# Patient Record
Sex: Male | Born: 1975 | Race: White | Hispanic: No | Marital: Married | Smoking: Never smoker
Health system: Southern US, Community
[De-identification: ages and names within clinical notes are randomized; demographics above are authoritative.]

## PROBLEM LIST (undated history)

## (undated) DIAGNOSIS — J302 Other seasonal allergic rhinitis: Secondary | ICD-10-CM

## (undated) DIAGNOSIS — K219 Gastro-esophageal reflux disease without esophagitis: Secondary | ICD-10-CM

## (undated) DIAGNOSIS — B019 Varicella without complication: Secondary | ICD-10-CM

## (undated) DIAGNOSIS — J349 Unspecified disorder of nose and nasal sinuses: Secondary | ICD-10-CM

## (undated) DIAGNOSIS — R040 Epistaxis: Secondary | ICD-10-CM

## (undated) HISTORY — DX: Unspecified disorder of nose and nasal sinuses: J34.9

## (undated) HISTORY — DX: Epistaxis: R04.0

## (undated) HISTORY — DX: Other seasonal allergic rhinitis: J30.2

## (undated) HISTORY — PX: TONSILLECTOMY AND ADENOIDECTOMY: SUR1326

## (undated) HISTORY — DX: Gastro-esophageal reflux disease without esophagitis: K21.9

## (undated) HISTORY — DX: Varicella without complication: B01.9

---

## 1998-07-09 ENCOUNTER — Emergency Department (HOSPITAL_COMMUNITY): Admission: EM | Admit: 1998-07-09 | Discharge: 1998-07-09 | Payer: Self-pay | Admitting: Emergency Medicine

## 1999-09-09 ENCOUNTER — Emergency Department (HOSPITAL_COMMUNITY): Admission: EM | Admit: 1999-09-09 | Discharge: 1999-09-09 | Payer: Self-pay | Admitting: Emergency Medicine

## 1999-09-09 ENCOUNTER — Encounter: Payer: Self-pay | Admitting: Emergency Medicine

## 2000-03-19 ENCOUNTER — Emergency Department (HOSPITAL_COMMUNITY): Admission: EM | Admit: 2000-03-19 | Discharge: 2000-03-19 | Payer: Self-pay | Admitting: Emergency Medicine

## 2004-12-13 HISTORY — PX: APPENDECTOMY: SHX54

## 2005-06-05 ENCOUNTER — Inpatient Hospital Stay (HOSPITAL_COMMUNITY): Admission: EM | Admit: 2005-06-05 | Discharge: 2005-06-14 | Payer: Self-pay | Admitting: Emergency Medicine

## 2005-06-06 ENCOUNTER — Encounter (INDEPENDENT_AMBULATORY_CARE_PROVIDER_SITE_OTHER): Payer: Self-pay | Admitting: Specialist

## 2007-05-06 ENCOUNTER — Emergency Department (HOSPITAL_COMMUNITY): Admission: EM | Admit: 2007-05-06 | Discharge: 2007-05-06 | Payer: Self-pay | Admitting: Emergency Medicine

## 2008-07-29 ENCOUNTER — Emergency Department (HOSPITAL_COMMUNITY): Admission: EM | Admit: 2008-07-29 | Discharge: 2008-07-30 | Payer: Self-pay | Admitting: Emergency Medicine

## 2010-06-01 ENCOUNTER — Emergency Department (HOSPITAL_COMMUNITY): Admission: EM | Admit: 2010-06-01 | Discharge: 2010-06-02 | Payer: Self-pay | Admitting: Emergency Medicine

## 2011-04-30 NOTE — Op Note (Signed)
Mark Heath, Mark Heath                ACCOUNT NO.:  [id number]   MEDICAL RECORD NO.:  [id number]          PATIENT TYPE:  INP   LOCATION:  5707                         FACILITY:  [facility]   PHYSICIAN:  Gabrielle Dare. Janee Morn, M.D.DATE OF BIRTH:  May 27, 1976   DATE OF PROCEDURE:  06/06/2005  DATE OF DISCHARGE:                                 OPERATIVE REPORT   PREOPERATIVE DIAGNOSES:  1.  Small bowel obstruction.  2.  Sigmoid diverticulitis.   POSTOPERATIVE DIAGNOSIS:  Perforated appendicitis.   PROCEDURE:  1.  Exploratory laparotomy.  2.  Appendectomy.   SURGEON:  Violeta Gelinas, M.D.   ASSISTANT:  Leonie Man, M.D.   ANESTHESIA:  General.   HISTORY OF PRESENT ILLNESS:  The patient is a 35 year old white male with no  previous abdominal surgery who presented with a small bowel obstruction.  He  was admitted by Dr. Lindie Spruce.  He underwent CT scan over night which showed a  proximal small bowel obstruction and sigmoid diverticulitis.  He was brought  to the operating room for emergent exploration.   PROCEDURE IN DETAIL:  Informed consent was obtained.  Patient received  intravenous antibiotics.  He was identified and brought to the operating  room.  General anesthesia was administered.  His abdomen was prepped and  draped in a sterile fashion.  A lower midline incision was made.  Subcutaneous tissues were dissected down, revealing the anterior fascia  which was divided sharply.  The peritoneal cavity was then carefully entered  under direct vision without difficulty.  There was a small amount of  ascites.  Exploration revealed an inflammatory process going on in the  pelvis along the superomedial aspect of the sigmoid colon.  There was a  small piece of small bowel stuck down in there which was mobilized,  revealing a perforated appendicitis with a small collection of purulent  material.  This was sent for culture.  The appendix was mobilized away from  the sigmoid and separated from  the small bowel.  The base was intact.  The  base was then dissected free of the mesocolon and clamped with two straight  clamps and was divided in between.  The mesoappendix was divided with two  Kelly clamps and the appendix was removed, it was very inflamed and  perforated near its tip.  This was sent to pathology.  The mesoappendix was  tied to get excellent hemostasis and the appendiceal stump was suture  ligated with 2-0 silk for excellent closure.  Once that was accomplished,  the abdomen was reinspected.  The sigmoid was intact.  There was some  fibrinous exudate present where the appendix had been laying on top of it.  There did not appear to be a primary sigmoid colon process.  The abdomen was  copiously irrigated with several liters of saline.  Bowel was again checked  and bowel contents easily were starting to flow into the distal collapsed  portion.  There was some inflammation of the mesentery of the piece of small  bowel that was stuck down to this inflammatory process but the bowel wall  was intact.  The bowel was returned to anatomic position, the omentum was  replaced and after ensuring excellent hemostasis the abdomen was then  closed.  The fascia was closed with a running #1 PDS, one from each end of  the  incision tied in the middle.  Subcutaneous tissues were copiously irrigated  and the skin was closed with staples.  Sponge, needle and instrument counts  were correct.  A sterile dressing was applied.  The patient tolerated the  procedure well without apparent complication.  He was taken to the recovery  room in stable condition.       BET/MEDQ  D:  06/06/2005  T:  06/06/2005  Job:  [id number]

## 2011-04-30 NOTE — H&P (Signed)
Mark Heath, Mark Heath                ACCOUNT NO.:  [id number]   MEDICAL RECORD NO.:  [id number]          PATIENT TYPE:  INP   LOCATION:  5707                         FACILITY:  [facility]   PHYSICIAN:  Cherylynn Ridges, M.D.    DATE OF BIRTH:  1976/12/08   DATE OF ADMISSION:  06/05/2005  DATE OF DISCHARGE:                                HISTORY & PHYSICAL   IDENTIFICATION AND CHIEF COMPLAINT:  The patient is a 35 year old with a  several day history of nausea, vomiting, and diarrhea, who comes in with a  radiologic picture of a bowel obstruction.   HISTORY OF PRESENT ILLNESS:  The patient reports that his problems began  about three days ago after doing a significant amount of lifting at his job  at [employer].  He came on with cramping abdominal pain, and since that  time has had some diarrhea, nausea, and vomiting.  The volume of vomitus has  diminished over time, but he has continued to be nauseated and vomiting.  He  came into the emergency room where an evaluation was done with plain films  which demonstrated multiple air fluid levels, but air in the colon, no free  air.  It is indicative of either a high-grade partial small bowel  obstruction or significant ileus.   PAST MEDICAL HISTORY:  Unremarkable.   PAST SURGICAL HISTORY:  Unremarkable.  He has had no previous surgery.   MEDICATIONS:  He takes no medications.   ALLERGIES:  No known drug allergies.   SOCIAL HISTORY:  He does not drink alcohol, take any illicit drugs, or smoke  tobacco.  He works at [employer].  His girlfriend is at his bedside.   REVIEW OF SYSTEMS:  Up until this, he has been healthy.  He has had some  dark stool and concentrated urine by his report, and the amount of urine has  diminished over time.   PHYSICAL EXAMINATION:  VITAL SIGNS:  He is afebrile, pulse 82, blood  pressure 110/65.  SKIN:  Pale, he has decreased skin turgor and parched mucous membranes.  HEENT:  Normocephalic, atraumatic.  Eyes are sunken  in.  There is a little  watery fluid.  Mucous membranes actually are pink, but parched.  NECK:  Supple, there are no carotid bruits, no palpable masses.  LUNGS:  Clear to auscultation.  He has symmetrical excursion.  CARDIAC:  Regular rate and rhythm.  No murmurs, rubs, gallops, or heaves.  ABDOMEN:  Distended, but not tense.  He has no diffuse peritonitis.  He has  hypoactive bowel sounds with no rebound or guarding, no palpable masses, no  focal tenderness.  RECTAL:  Mucoid yellow fluid expressed from the rectum, no gross blood, and  is guaiac negative.  Prostate was normal.   LABORATORY DATA:  His white count was 14,400, his hemoglobin is 15.1.  His  electrolytes show a sodium of 131, a potassium of 2.9.  His PT is 15.6, INR  of 1.2.  UA shows concentration at 1.031.   IMPRESSION:  What appears to be a high-grade partial small bowel  obstruction,  but might be a significant ileus.  I was able to pass a NG tube  in this patient and decompress him, and most of this appeared to be gas and  air, minimal fluid, and the NG tube confirmed to be in position.  I reviewed  the x-rays and inflammatory studies, and I cannot find any source for this  possible obstruction outside of a possible tumor.  I did not find the  patient to have any hernias in his groin as well as in his periumbilical  area.   PLAN:  Admit him, decompress him with NG tube, then do a CT scan of the  abdomen with contrast.  This may give [place] more information about what is  causing the patient's current problem, or it could possibly just be a  significant gastroenteritis which should resolve over time.  His BUN and  creatinine are elevated at 37 and 1.6.  I will rehydrate the patient and  also will place his K+ to 1.9.       JOW/MEDQ  D:  06/05/2005  T:  06/06/2005  Job:  [id number]

## 2011-04-30 NOTE — Discharge Summary (Signed)
NAMECHARLEE, Mark Heath                ACCOUNT NO.:  [id number]   MEDICAL RECORD NO.:  [id number]          PATIENT TYPE:  INP   LOCATION:  5709                         FACILITY:  [facility]   PHYSICIAN:  Lebron Conners, M.D.   DATE OF BIRTH:  Dec 17, 1975   DATE OF ADMISSION:  06/05/2005  DATE OF DISCHARGE:  06/14/2005                                 DISCHARGE SUMMARY   DISCHARGE DIAGNOSES:  1.  Perforated appendicitis status post appendectomy, June 06, 2005.  2.  Wound infection containing E. coli susceptible to Cipro.   HOSPITAL COURSE:  Mr. Matters is a 35 year old male patient who has a three  day history of nausea, vomiting and diarrhea. He presented to [facility] Emergency  Room complaining of lower abdominal pain and discomfort. Initially, he was  felt to have a small bowel obstruction and sigmoid diverticulitis. He was  taken to the operating room by Laurell Josephs E. Janee Morn, M.D. and underwent  exploratory laparotomy. He was found to have a perforated appendicitis and  underwent appendectomy.   Postoperatively, he did have an NG tube and it took several days for him to  have this removed. He gradually increased his ambulation. He did have a  wound infection that grew out E. coli susceptible to Cipro. He was  discharged home on postoperative day #8 in stable condition. He will be  discharged to home on the current medications:  Phenergan, Vicodin, Cipro  for 10 days and Flagyl for 10 days.   Because he does have an open wound secondary to infection, a Home Health  nurse has been arranged. He is not to drive for one week, no lifting for  three weeks. He is not to return to work until cleared by Dr. Janee Morn. He  may increase his activity slowly. He is to followup next week for a wound  check with Dr. Lurene Shadow who assisted on this surgery. On June 21, 2005 at 9:20  a.m., he is to followup with Dr. Janee Morn for his regular visit on June 30, 2005 at 9:45 a.m. He is to call for a fever or 101.       LB/MEDQ  D:  06/14/2005  T:  06/14/2005  Job:  [id number]   cc:   Gabrielle Dare. Janee Morn, M.D.  [facility]  [address] [place], [place] [address]

## 2011-06-13 HISTORY — PX: HERNIA REPAIR: SHX51

## 2011-07-05 ENCOUNTER — Inpatient Hospital Stay (HOSPITAL_COMMUNITY)
Admission: EM | Admit: 2011-07-05 | Discharge: 2011-07-07 | DRG: 352 | Disposition: A | Discharge status: Home / Self Care | Payer: Self-pay | Attending: General Surgery | Admitting: General Surgery

## 2011-07-05 DIAGNOSIS — K429 Umbilical hernia without obstruction or gangrene: Secondary | ICD-10-CM | POA: Diagnosis present

## 2011-07-05 DIAGNOSIS — K4030 Unilateral inguinal hernia, with obstruction, without gangrene, not specified as recurrent: Principal | ICD-10-CM | POA: Diagnosis present

## 2011-07-06 ENCOUNTER — Emergency Department (HOSPITAL_COMMUNITY): Payer: Self-pay

## 2011-07-06 DIAGNOSIS — K4030 Unilateral inguinal hernia, with obstruction, without gangrene, not specified as recurrent: Secondary | ICD-10-CM

## 2011-07-06 DIAGNOSIS — K429 Umbilical hernia without obstruction or gangrene: Secondary | ICD-10-CM

## 2011-07-06 LAB — DIFFERENTIAL
Basophils Relative: 0 % (ref 0–1)
Eosinophils Relative: 1 % (ref 0–5)
Lymphocytes Relative: 31 % (ref 12–46)
Monocytes Absolute: 0.7 10*3/uL (ref 0.1–1.0)
Monocytes Relative: 9 % (ref 3–12)
Neutro Abs: 4.3 10*3/uL (ref 1.7–7.7)

## 2011-07-06 LAB — POCT I-STAT, CHEM 8
BUN: 13 mg/dL (ref 6–23)
Calcium, Ion: 1.23 mmol/L (ref 1.12–1.32)
Chloride: 108 mEq/L (ref 96–112)
Creatinine, Ser: 0.8 mg/dL (ref 0.50–1.35)
HCT: 41 % (ref 39.0–52.0)
Hemoglobin: 13.9 g/dL (ref 13.0–17.0)
Potassium: 4.3 mEq/L (ref 3.5–5.1)
Sodium: 140 mEq/L (ref 135–145)

## 2011-07-06 LAB — CBC
HCT: 39.7 % (ref 39.0–52.0)
MCH: 32.6 pg (ref 26.0–34.0)
Platelets: 230 10*3/uL (ref 150–400)
RBC: 4.38 MIL/uL (ref 4.22–5.81)
RDW: 11.9 % (ref 11.5–15.5)

## 2011-07-08 NOTE — Op Note (Signed)
Mark Heath, Mark Heath NO.:  [id number]  MEDICAL RECORD NO.:  [id number]  LOCATION:  5127                         FACILITY:  [facility]  PHYSICIAN:  Gabrielle Dare. Janee Morn, M.D.DATE OF BIRTH:  September 15, 1976  DATE OF PROCEDURE:  07/06/2011 DATE OF DISCHARGE:                              OPERATIVE REPORT   PREOPERATIVE DIAGNOSES: 1. Umbilical hernia. 2. Incarcerated left inguinal hernia.  POSTOPERATIVE DIAGNOSES: 1. Umbilical hernia. 2. Incarcerated left inguinal hernia.  PROCEDURES: 1. Repair of umbilical hernia with mesh. 2. Repair of incarcerated left inguinal hernia with mesh.  SURGEON:  Gabrielle Dare. Janee Morn, MD  ANESTHESIA:  General endotracheal.  HISTORY OF PRESENT ILLNESS:  Mark Heath is a 35 year old gentleman who was admitted overnight with nausea and vomiting and he was noted to have a reducible umbilical hernia and an incarcerated left inguinal hernia. He is brought to the operating room for repair of both.  PROCEDURE IN DETAIL:  Informed consent was obtained.  The patient was identified in the preop holding area.  He received intravenous antibiotics.  He was brought to the operating room.  General endotracheal anesthesia was administered by the Anesthesia staff.  His abdomen and groins were all prepped and draped in sterile fashion.  We did a time-out procedure.  Attention was first directed to the umbilical hernia area.  A towel was placed over the groin.  In the interim, the area beneath the umbilicus was infiltrated with 0.25% Marcaine with epinephrine.  An infraumbilical incision was made.  Subcutaneous tissues were dissected down and around the umbilical stump.  The umbilical skin was dissected free off from the underlying tissues.  The hernia sac was then opened, it contained only omentum.  The hernia sac was gradually excised and the omentum was freed up circumferentially and reduced back into the abdomen after ensuring hemostasis.  The fascia was  cleaned up circumferentially.  The hernia repair was then completed with a 4.3-cm Proceed hernia system patch.  The mesh was inserted and laid flat and up against the fascia.  The inferior leaflet was tacked to the fascia with interrupted 2-0 Prolene sutures and then trimmed away.  The superior leaflet of the mesh was then tacked to the fascia with interrupted 2-0 Prolene sutures and that the flap was also excised.  Next, a lateral 2-0 Prolene suture was placed on either side, tacking the mesh to the fascia.  Area was copiously irrigated.  Hemostasis was ensured.  The umbilicus was then tacked back down to the underlying fascia with 2-0 Vicryl sutures in the subcutaneous tissue.  Further subcutaneous tissues were closed with interrupted 3-0 Vicryl suture and the skin was closed with running 4-0 Monocryl subcuticular stitch followed by Dermabond. Sponge, needle, and instrument counts were all correct for that part of the procedure.  We then turned our attention to the left groin.  The area of the anterosuperior iliac spine on the left side and then downalong the planned line of groin incision was infiltrated with 0.25% Marcaine with epinephrine.  Left groin incision was made.  Subcutaneous tissues were dissected down through Scarpa fascia, revealing the attenuated external oblique fascia.  There was a large hernia medially. The  fascia was divided laterally and the division was continued through the external ring.  The inferior leaflet of the external oblique was dissected free off the cord and the hernia revealing the shelving edge of the inguinal ligament and then the superior leaflet was dissected free, revealing the transversalis.  The cord was then dissected with a large hernia sac containing omentum identified.  This was dissected free from the cord structures protecting them.  The hernia sac was then opened, it was quite attenuated, but it contained only omentum.  This was all reduced  back into the abdomen and the hernia sac was high ligated with a suture ligature of 2-0 Vicryl.  Next, the floor was inspected and there was no direct hernia component.  The hernia repair was then completed with a keyhole polypropylene mesh.  This was trimmed to custom size and shape and then tacked to the tissues over the pubic tubercle medially with 2-0 Prolene.  We then used running 2-0 Prolene to tack this mesh down to the shelving edge of the inguinal ligament and more out laterally.  Next, the superior portion of the mesh was tacked down to the tissues over the pubic tubercle again medially with interrupted 2-0 Prolene sutures and then a series of interrupted 2-0 Prolene sutures were used to tack the mesh down to the transversalis, extending out laterally.  The 2 leaflets of the mesh were tacked together into the underlying musculature at far side of the cord structures.  Leaflets of the mesh were left long and tucked out laterally beneath the external oblique fascia and further interrupted 2- 0 Prolene sutures were placed to tack them together into the underlying musculature.  Next, some additional local anesthetic was placed. Meticulous hemostasis was ensured.  The external oblique fascia was then closed with a running 2-0 Vicryl suture.  Subcutaneous tissues were irrigated and then closed with interrupted 3-0 Vicryl sutures and the skin was closed with running 4-0 Monocryl subcuticular stitch followed by Dermabond.  The sponge, needle, and instrument counts were all correct.  The patient's left testicle was returned to anatomic position in the scrotum at the complication of the procedure.  There were no apparent complications and he was taken to the recovery room in stable condition.     Gabrielle Dare Janee Morn, M.D.     BET/MEDQ  D:  07/06/2011  T:  07/07/2011  Job:  [id number]  Electronically Signed by Violeta Gelinas M.D. on 07/08/2011 08:49:42 AM

## 2011-07-11 NOTE — H&P (Signed)
NAME:  Mark Heath, Mark Heath NO.:  [id number]  MEDICAL RECORD NO.:  [id number]  LOCATION:  [facility]                         FACILITY:  [facility]  PHYSICIAN:  Abigail Miyamoto, M.D. DATE OF BIRTH:  06-07-1976  DATE OF ADMISSION:  07/05/2011 DATE OF DISCHARGE:                             HISTORY & PHYSICAL   CHIEF COMPLAINT:  Abdominal pain and diarrhea.  HISTORY:  This is a 35 year old gentleman who reports having had a known left inguinal hernia and umbilical hernia, presents with abdominal pain and diarrhea.  He reports the pain is very mild, felt the diarrhea, presented to the emergency department.  He was found to have a chronic incarcerated inguinal hernia as well as umbilical hernia and surgeries. The patient denied any nausea or vomiting.  He has had for some time the inguinal hernia going down into his scrotum.  He currently is in almost no pain.  He otherwise is without complaints.  The pain he was having was mild, cramping in nature.  PAST MEDICAL AND SURGICAL HISTORY:  Exploratory laparotomy, appropriate appendicitis in 2006.  Otherwise, he is healthy.  MEDICATIONS:  None.  ALLERGIES:  No known drug allergies.  SOCIAL HISTORY:  He does not smoke, does not drink alcohol.  He does a lot of heavy lifting job.  FAMILY HISTORY:  Negative for hypertension or diabetes.  REVIEW OF SYSTEMS:  GENERAL:  Negative for fever or chills.  PULMONARY: Negative for cough, shortness breath, or difficulty breathing.  CARDIAC: Negative for chest pain, irregular heartbeat.  ABDOMEN:  Listed as above.  URINARY:  Negative for dysuria or hematuria.  Rest of review of systems including skin, eyes, ears, nose, throat, musculoskeletal, psychiatric, endocrine normal.  PHYSICAL EXAMINATION:  GENERAL:  This is a well-developed, well- nourished gentleman, in no acute distress. VITAL SIGNS:  Temperature 98.1, heart rate 48, respiratory rate 16, and blood pressure is 108/60. EYES:   Anicteric.  Pupils are reactive bilaterally. ENT:  External ears and nose are normal.  Hearing is normal.  Oropharynx is clear. NECK:  Supple.  Trachea is midline.  There is no thyromegaly. LUNGS:  Clear to auscultation bilaterally.  No respiratory effort. CARDIOVASCULAR:  Regular rate and rhythm.  There are no murmurs.  There is no peripheral edema. ABDOMEN:  Soft.  He has a well-healed midline incision.  There is an easily reducible umbilical hernia.  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  This is nontender as suspected, contained omentum.  Testicles descended bilaterally.  Abdomen is otherwise soft and nontender.  There is no tenderness of the left inguinal area. EXTREMITIES:  Warm and well perfused.  No edema, clubbing, or cyanosis. Peripheral pulses are intact to all 4 extremities.  SKIN:  No rashes and no jaundice. NEUROLOGIC:  He is awake, alert, and oriented.  Motor and sensory function grossly intact to all 4 extremities. PSYCHIATRIC:  Normal judgment and affect.  DATA REVIEWED:  The patient's white blood count is 7.4 with no left shift.  IMPRESSION:  This is a patient with a slightly symptomatic, chronically incarcerated left inguinal hernia as well as a reducible umbilical hernia. At this point, the patient is in no distress.  I do not believe emergent repair is necessary at this point.  We will go, however, to consider a repair later today.  I will currently keep him n.p.o. until at that time.  Hopefully, both inguinal hernia and umbilical hernia can be fixed with mesh during this admission.     Abigail Miyamoto, M.D.     DB/MEDQ  D:  07/06/2011  T:  07/06/2011  Job:  [id number]  Electronically Signed by Abigail Miyamoto M.D. on 07/11/2011 02:04:47 PM

## 2011-07-15 ENCOUNTER — Telehealth (INDEPENDENT_AMBULATORY_CARE_PROVIDER_SITE_OTHER): Payer: Self-pay

## 2011-07-15 NOTE — Telephone Encounter (Signed)
Patient walked in for refill of percocet and I offered him the protocol.  I called in Hydrocodone 5-325 one tab po q4-6prn #30 no refills generic allowed to [facility]  [phone].  He also asked for a return to work note and I wrote one for September 4th since he works at [employer] and lifts at least 70 lbs.  He will Dr Andrey Campanile on August 10th  in Dr Thompson's absence.

## 2011-07-23 ENCOUNTER — Ambulatory Visit (INDEPENDENT_AMBULATORY_CARE_PROVIDER_SITE_OTHER): Payer: BC Managed Care – PPO | Admitting: General Surgery

## 2011-07-23 ENCOUNTER — Encounter (INDEPENDENT_AMBULATORY_CARE_PROVIDER_SITE_OTHER): Payer: Self-pay | Admitting: General Surgery

## 2011-07-23 DIAGNOSIS — Z09 Encounter for follow-up examination after completed treatment for conditions other than malignant neoplasm: Secondary | ICD-10-CM

## 2011-07-23 NOTE — Progress Notes (Signed)
Procedure: Status post umbilical hernia repair with mesh; status post repair of incarcerated left inguinal hernia with mesh July 23 by Dr. Janee Morn  History of Present Ilness: 35 year old male comes in today for his first postoperative appointment. He came into the ER several weeks ago with an incarcerated left inguinal hernia. Since discharge she is been doing fairly well. He had a fair amount of pain initially the first week after surgery. He states that he still has some discomfort in the left groin. This swelling has gone down dramatically. He denies any fevers or chills. He denies any nausea, vomiting, and diarrhea or constipation. He denies any dysuria. He is  concerned that he cannot do any heavy lifting for several more weeks.  Physical Exam: Well-developed well-nourished Caucasian male in no apparent distress Pulmonary-lungs are clear to auscultation Cardiac-regular rate and rhythm Abdomen-soft, nontender, and nondistended. Well-healed transverse infraumbilical incision. No hematoma or seroma. No signs of hernia recurrence. He has an old well-healed lower midline incision that is invaginated. GU-well-healed left inguinal incision. There appears to be a probable hematoma in the left inguinal canal. It does not seem to be consistent with a recurrence. This hard mass extends into his scrotum. Both testicles are descended. There is no signs of cellulitis or induration. Psych-appears slightly nervous  Data reviewed: I reviewed Dr. Carollee Massed operative note  Assessment and Plan: Status post umbilical hernia repair with mesh Status post repair of incarcerated left inguinal hernia with mesh  It appears that he may have developed a postoperative hematoma in his left groin. I advised him that this should continue to get smaller over time. I advised him that he should not lift anything greater than 10-15 pounds until after September 4 which will be 6 weeks after surgery. We will make  a followup  appointment with him to see Dr. Janee Morn in 4 weeks.

## 2011-07-23 NOTE — Patient Instructions (Signed)
Can resume full activities after August 17, 2011

## 2011-08-18 ENCOUNTER — Ambulatory Visit (INDEPENDENT_AMBULATORY_CARE_PROVIDER_SITE_OTHER): Payer: BC Managed Care – PPO | Admitting: General Surgery

## 2011-08-18 VITALS — BP 100/70 | HR 96

## 2011-08-18 DIAGNOSIS — Z09 Encounter for follow-up examination after completed treatment for conditions other than malignant neoplasm: Secondary | ICD-10-CM

## 2011-08-18 DIAGNOSIS — Z9889 Other specified postprocedural states: Secondary | ICD-10-CM

## 2011-08-18 DIAGNOSIS — Z8719 Personal history of other diseases of the digestive system: Secondary | ICD-10-CM

## 2011-08-18 NOTE — Progress Notes (Signed)
Subjective:     Patient ID: Mark Heath, male   DOB: 1976/05/13, 35 y.o.   MRN: [id number]  HPI  Patient is status post umbilical hernia repair with mesh and left inguinal hernia repair with mesh while hospitalized. Postoperatively doing well. The swelling has resolved. He has no pain. He is eating and moving his bowels well. He is having no trouble passing his urine. Review of Systems     Objective:   Physical Exam On physical exam his umbilical incision is well-healed. His hernia repair is intact. His left inguinal incision is also well healed. There are no signs of infection at either site. The patient's testicles are descended without significant edema or tenderness. His inguinal hernia repair is intact    Assessment:     Doing very well status post umbilical hernia repair with mesh and left inguinal hernia repair with mesh    Plan:        Return to work without restriction starting August 23, 2011 return p.r.n.

## 2013-07-09 ENCOUNTER — Encounter: Payer: Self-pay | Admitting: Family Medicine

## 2013-07-09 ENCOUNTER — Ambulatory Visit (INDEPENDENT_AMBULATORY_CARE_PROVIDER_SITE_OTHER): Payer: BC Managed Care – PPO | Admitting: Family Medicine

## 2013-07-09 VITALS — BP 128/88 | Temp 98.7°F | Ht 69.0 in | Wt 179.0 lb

## 2013-07-09 DIAGNOSIS — R0989 Other specified symptoms and signs involving the circulatory and respiratory systems: Secondary | ICD-10-CM

## 2013-07-09 DIAGNOSIS — J309 Allergic rhinitis, unspecified: Secondary | ICD-10-CM

## 2013-07-09 DIAGNOSIS — Z23 Encounter for immunization: Secondary | ICD-10-CM

## 2013-07-09 DIAGNOSIS — Z7689 Persons encountering health services in other specified circumstances: Secondary | ICD-10-CM

## 2013-07-09 DIAGNOSIS — Z7189 Other specified counseling: Secondary | ICD-10-CM

## 2013-07-09 DIAGNOSIS — R0683 Snoring: Secondary | ICD-10-CM

## 2013-07-09 MED ORDER — FLUTICASONE PROPIONATE 50 MCG/ACT NA SUSP: 2.0000 | Freq: Every day | NASAL | Status: DC

## 2013-07-09 NOTE — Addendum Note (Signed)
Addended by: Azucena Freed on: 07/09/2013 02:54 PM   Modules accepted: Orders

## 2013-07-09 NOTE — Patient Instructions (Addendum)
-  We have ordered labs or studies at this visit. It can take up to 1-2 weeks for results and processing. We will contact you with instructions IF your results are abnormal. Normal results will be released to your [facility]. If you have not heard from [place] or can not find your results in [facility] in 2 weeks please contact our office.  -PLEASE SIGN UP FOR MYCHART TODAY   We recommend the following healthy lifestyle measures: - eat a healthy diet consisting of lots of vegetables, fruits, beans, nuts, seeds, healthy meats such as white chicken and fish and whole grains.  - avoid fried foods, fast food, processed foods, sodas, red meet and other fattening foods.  - get a least 150 minutes of aerobic exercise per week.   -We placed a referral for you as discussed for a sleep sudy. It usually takes about 1-2 weeks to process and schedule this referral. If you have not heard from [place] regarding this appointment in 2 weeks please contact our office.  For sinus issues: -start Flonase 2 sprays each nostril daily for 1 month, then one spray each nostril -if not completely better in 2 weeks add claritin or allegra daily -follow up in 2 months  Follow up in: in next week for fasting labs, and in 2 months for sinuses

## 2013-07-09 NOTE — Progress Notes (Signed)
Chief Complaint  Patient presents with  . Establish Care  . Sleep Apnea  . Sinusitis    HPI:  Mark Heath is here to establish care.  Last PCP and physical: has not had PCP in a long time  Has the following chronic problems and concerns today:  1) chronic allergic rhinitis: -chronic nasal congestion and pnd - worse in spring and fall -also snores at night, wide thinks he stops breathing at night -denies: SOB, wheezing, daytime somnolence  There are no active problems to display for this patient.   Health Maintenance: -can't remember last tetanus shot  ROS: See pertinent positives and negatives per HPI.  Past Medical History  Diagnosis Date  . Sinus problem   . Bleeding nose     as a child  . Chicken pox   . GERD (gastroesophageal reflux disease)   . Seasonal allergies     Family History  Problem Relation Age of Onset  . Hypertension Mother   . Heart disease Mother 67  . Arthritis Mother   . Hyperlipidemia Mother   . Stroke Mother   . Diverticulitis Mother   . Hypertension Father   . Arthritis Father   . Hyperlipidemia Father   . Diabetes Father   . Dementia Father     History   Social History  . Marital Status: Married    Spouse Name: N/A    Number of Children: N/A  . Years of Education: N/A   Social History Main Topics  . Smoking status: Never Smoker   . Smokeless tobacco: None  . Alcohol Use: No  . Drug Use: No  . Sexually Active: None   Other Topics Concern  . 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  GENERAL: vitals reviewed and listed above, alert, oriented, appears well hydrated and in no acute distress  HEENT: atraumatic, conjunttiva clear, no obvious abnormalities on inspection of external nose and ears, normal appearance of ear canals and TMs, clear nasal congestion, mild post oropharyngeal erythema with PND, no tonsillar edema or exudate, no sinus TTP  NECK: no obvious masses on inspection  LUNGS: clear to auscultation bilaterally, no wheezes, rales or rhonchi, good air movement  CV: HRRR, no peripheral edema  MS: moves all extremities without noticeable abnormality  PSYCH: pleasant and cooperative, no obvious depression or anxiety  ASSESSMENT AND PLAN:  Discussed the following assessment and plan:  Encounter to establish care - Plan: Lipid Panel, Hemoglobin A1c  Snoring - Plan: Split night study  Allergic rhinitis - Plan: fluticasone (FLONASE) 50 MCG/ACT nasal spray    -We reviewed the PMH, PSH, FH, SH, Meds and Allergies. -We provided refills for any medications we will prescribe as needed. -We addressed current concerns per orders and patient instructions. -We have asked for records for pertinent exams, studies, vaccines and notes from previous providers. -We have advised patient to follow up per instructions below. -  tdap today  -Patient advised to return or notify a doctor immediately if symptoms worsen or persist or new concerns arise.  Patient Instructions  -We have ordered labs or studies at this visit. It can take up to 1-2 weeks for results and processing. We will contact you with instructions IF your results are abnormal. Normal results will be released to your [facility] : [facility]. If you have not heard from [place] or can not find your results in [facility] in 2 weeks please contact our office.  -PLEASE SIGN UP FOR MYCHART TODAY   We recommend the following healthy lifestyle measures: - eat a healthy diet consisting of lots of vegetables, fruits, beans, nuts,  seeds, healthy meats such as white chicken and fish and whole grains.  - avoid fried foods, fast food, processed foods, sodas, red meet and other fattening foods.  - get a least 150 minutes of aerobic exercise per week.   -We placed a referral for you as discussed for a sleep sudy. It usually takes about 1-2 weeks to process and schedule this referral. If you have not heard from [place] regarding this appointment in 2 weeks please contact our office.  For sinus issues: -start Flonase 2 sprays each nostril daily for 1 month, then one spray each nostril -if not completely better in 2 weeks add claritin or allegra daily -follow up in 2 months  Follow up in: in next week for fasting labs, and in 2 months for sinuses      Latriece Anstine R.

## 2013-07-10 NOTE — Addendum Note (Signed)
Addended by: Azucena Freed on: 07/10/2013 10:16 AM   Modules accepted: Orders

## 2013-08-10 ENCOUNTER — Ambulatory Visit (INDEPENDENT_AMBULATORY_CARE_PROVIDER_SITE_OTHER): Payer: BC Managed Care – PPO

## 2013-08-10 DIAGNOSIS — Z23 Encounter for immunization: Secondary | ICD-10-CM

## 2013-08-19 ENCOUNTER — Ambulatory Visit (HOSPITAL_BASED_OUTPATIENT_CLINIC_OR_DEPARTMENT_OTHER): Payer: BC Managed Care – PPO | Attending: Family Medicine

## 2013-11-22 ENCOUNTER — Encounter (HOSPITAL_COMMUNITY): Payer: Self-pay | Admitting: Emergency Medicine

## 2013-11-22 ENCOUNTER — Emergency Department (HOSPITAL_COMMUNITY)
Admission: EM | Admit: 2013-11-22 | Discharge: 2013-11-22 | Disposition: A | Discharge status: Home / Self Care | Payer: BC Managed Care – PPO | Source: Home / Self Care | Attending: Family Medicine | Admitting: Family Medicine

## 2013-11-22 DIAGNOSIS — J069 Acute upper respiratory infection, unspecified: Secondary | ICD-10-CM

## 2013-11-22 MED ORDER — AZITHROMYCIN 250 MG PO TABS: 250.0000 mg | ORAL_TABLET | Freq: Every day | ORAL | Status: DC

## 2013-11-22 MED ORDER — IPRATROPIUM BROMIDE 0.06 % NA SOLN: 2.0000 | Freq: Four times a day (QID) | NASAL | Status: DC

## 2013-11-22 MED ORDER — GUAIFENESIN-CODEINE 100-10 MG/5ML PO SOLN: 5.0000 mL | Freq: Every evening | ORAL | Status: DC | PRN

## 2013-11-22 NOTE — ED Provider Notes (Signed)
Mark Heath is a 37 y.o. male who presents to Urgent Care today for cough and congestion with mild nasal discharge and sore throat. This is been present for about 4 days but worsened over the past day or 2. He also notes some muscle soreness. He denies any nausea vomiting or diarrhea. He denies any shortness of breath or chest pains. History of over-the-counter cough medication and Motrin which is been mildly helpful. He is well otherwise.   Past Medical History  Diagnosis Date  . Sinus problem   . Bleeding nose     as a child  . Chicken pox   . GERD (gastroesophageal reflux disease)   . Seasonal allergies    History  Substance Use Topics  . Smoking status: Never Smoker   . Smokeless tobacco: Not on file  . Alcohol Use: No   ROS as above Medications reviewed. No current facility-administered medications for this encounter.   Current Outpatient Prescriptions  Medication Sig Dispense Refill  . azithromycin (ZITHROMAX) 250 MG tablet Take 1 tablet (250 mg total) by mouth daily. Take first 2 tablets together, then 1 every day until finished.  6 tablet  0  . guaiFENesin-codeine 100-10 MG/5ML syrup Take 5 mLs by mouth at bedtime as needed for cough.  120 mL  0  . ipratropium (ATROVENT) 0.06 % nasal spray Place 2 sprays into both nostrils 4 (four) times daily.  15 mL  1  . [DISCONTINUED] fluticasone (FLONASE) 50 MCG/ACT nasal spray Place 2 sprays into the nose daily.  16 g  6    Exam:  BP 135/85  Pulse 87  Temp(Src) 98.8 F (37.1 C) (Oral)  Resp 16  SpO2 99% Gen: Well NAD HEENT: EOMI,  MMM, tympanic membranes are normal appearing bilaterally. Posterior pharynx with cobblestoning Lungs: Normal work of breathing. CTABL Heart: RRR no MRG Abd: NABS, Soft. NT, ND Exts: Non edematous BL  LE, warm and well perfused.   Assessment and Plan: 37 y.o. male with viral URI. Plan for symptomatic management with codeine containing cough medication, Atrovent and NSAIDs. If not better would  consider using azithromycin which I prescribed. Discussed warning signs or symptoms. Please see discharge instructions. Patient expresses understanding.      Rodolph Bong, MD 11/22/13 [phone]

## 2013-11-22 NOTE — ED Notes (Signed)
Pt c/o cold sxs onset 4 days.... Reports his children are sick w/colds as well Sxs today include: cough w/yellow mucous, congestion, ST Has tried OTC cold meds w/no relief. Denies: f/v/n/d, SOB, wheezing Alert w/no signs of acute distress.

## 2015-08-12 ENCOUNTER — Inpatient Hospital Stay (HOSPITAL_COMMUNITY)
Admission: EM | Admit: 2015-08-12 | Discharge: 2015-08-16 | DRG: 351 | Disposition: A | Discharge status: Home / Self Care | Payer: BLUE CROSS/BLUE SHIELD | Attending: General Surgery | Admitting: General Surgery

## 2015-08-12 ENCOUNTER — Emergency Department (HOSPITAL_COMMUNITY): Payer: BLUE CROSS/BLUE SHIELD

## 2015-08-12 ENCOUNTER — Encounter (HOSPITAL_COMMUNITY): Admission: EM | Disposition: A | Discharge status: Home / Self Care | Payer: Self-pay | Source: Home / Self Care

## 2015-08-12 ENCOUNTER — Emergency Department (HOSPITAL_COMMUNITY): Payer: BLUE CROSS/BLUE SHIELD | Admitting: Anesthesiology

## 2015-08-12 ENCOUNTER — Encounter (HOSPITAL_COMMUNITY): Payer: Self-pay | Admitting: *Deleted

## 2015-08-12 DIAGNOSIS — I861 Scrotal varices: Secondary | ICD-10-CM | POA: Diagnosis present

## 2015-08-12 DIAGNOSIS — R300 Dysuria: Secondary | ICD-10-CM | POA: Diagnosis present

## 2015-08-12 DIAGNOSIS — Z8249 Family history of ischemic heart disease and other diseases of the circulatory system: Secondary | ICD-10-CM

## 2015-08-12 DIAGNOSIS — K4031 Unilateral inguinal hernia, with obstruction, without gangrene, recurrent: Principal | ICD-10-CM | POA: Diagnosis present

## 2015-08-12 DIAGNOSIS — N508 Other specified disorders of male genital organs: Secondary | ICD-10-CM | POA: Diagnosis not present

## 2015-08-12 DIAGNOSIS — R319 Hematuria, unspecified: Secondary | ICD-10-CM | POA: Diagnosis present

## 2015-08-12 DIAGNOSIS — Z833 Family history of diabetes mellitus: Secondary | ICD-10-CM

## 2015-08-12 DIAGNOSIS — Z823 Family history of stroke: Secondary | ICD-10-CM

## 2015-08-12 DIAGNOSIS — K219 Gastro-esophageal reflux disease without esophagitis: Secondary | ICD-10-CM | POA: Diagnosis present

## 2015-08-12 DIAGNOSIS — K567 Ileus, unspecified: Secondary | ICD-10-CM | POA: Diagnosis not present

## 2015-08-12 DIAGNOSIS — K4030 Unilateral inguinal hernia, with obstruction, without gangrene, not specified as recurrent: Secondary | ICD-10-CM | POA: Diagnosis present

## 2015-08-12 DIAGNOSIS — K460 Unspecified abdominal hernia with obstruction, without gangrene: Secondary | ICD-10-CM

## 2015-08-12 HISTORY — PX: INGUINAL HERNIA REPAIR: SHX194

## 2015-08-12 LAB — CBC WITH DIFFERENTIAL/PLATELET
BASOS ABS: 0 10*3/uL (ref 0.0–0.1)
Basophils Relative: 0 % (ref 0–1)
Eosinophils Absolute: 0 10*3/uL (ref 0.0–0.7)
Eosinophils Relative: 0 % (ref 0–5)
HEMATOCRIT: 42.9 % (ref 39.0–52.0)
Hemoglobin: 15 g/dL (ref 13.0–17.0)
LYMPHS PCT: 18 % (ref 12–46)
Lymphs Abs: 1.6 10*3/uL (ref 0.7–4.0)
MCH: 32.3 pg (ref 26.0–34.0)
MCHC: 35 g/dL (ref 30.0–36.0)
MCV: 92.3 fL (ref 78.0–100.0)
Monocytes Absolute: 0.6 10*3/uL (ref 0.1–1.0)
Monocytes Relative: 7 % (ref 3–12)
NEUTROS ABS: 6.8 10*3/uL (ref 1.7–7.7)
Neutrophils Relative %: 75 % (ref 43–77)
Platelets: 238 10*3/uL (ref 150–400)
RBC: 4.65 MIL/uL (ref 4.22–5.81)
RDW: 11.9 % (ref 11.5–15.5)
WBC: 9 10*3/uL (ref 4.0–10.5)

## 2015-08-12 LAB — COMPREHENSIVE METABOLIC PANEL
ALT: 27 U/L (ref 17–63)
AST: 28 U/L (ref 15–41)
Albumin: 3.9 g/dL (ref 3.5–5.0)
Alkaline Phosphatase: 47 U/L (ref 38–126)
Anion gap: 9 (ref 5–15)
BILIRUBIN TOTAL: 1.3 mg/dL — AB (ref 0.3–1.2)
BUN: 15 mg/dL (ref 6–20)
CHLORIDE: 107 mmol/L (ref 101–111)
CO2: 23 mmol/L (ref 22–32)
CREATININE: 0.9 mg/dL (ref 0.61–1.24)
Calcium: 9.5 mg/dL (ref 8.9–10.3)
GFR calc Af Amer: >60 mL/min (ref >60)
Glucose, Bld: 147 mg/dL — ABNORMAL HIGH (ref 65–99)
Potassium: 3.7 mmol/L (ref 3.5–5.1)
Sodium: 139 mmol/L (ref 135–145)
Total Protein: 6.7 g/dL (ref 6.5–8.1)

## 2015-08-12 LAB — I-STAT CG4 LACTIC ACID, ED: Lactic Acid, Venous: 1.34 mmol/L (ref 0.5–2.0)

## 2015-08-12 LAB — LIPASE, BLOOD: LIPASE: 20 U/L — AB (ref 22–51)

## 2015-08-12 SURGERY — REPAIR, HERNIA, INGUINAL, INCARCERATED
Anesthesia: General | Site: Groin | Laterality: Left

## 2015-08-12 MED ORDER — MIDAZOLAM HCL 2 MG/2ML IJ SOLN
INTRAMUSCULAR | Status: DC | PRN
  Administered 2015-08-12: 2 mg via INTRAVENOUS

## 2015-08-12 MED ORDER — PHENYLEPHRINE HCL 10 MG/ML IJ SOLN
INTRAMUSCULAR | Status: DC | PRN
  Administered 2015-08-12: 80 ug via INTRAVENOUS

## 2015-08-12 MED ORDER — IOHEXOL 300 MG/ML  SOLN: 25.0000 mL | Freq: Once | INTRAMUSCULAR | Status: DC | PRN

## 2015-08-12 MED ORDER — HYDROMORPHONE HCL 1 MG/ML IJ SOLN
1.0000 mg | Freq: Once | INTRAMUSCULAR | Status: AC
  Administered 2015-08-12: 1 mg via INTRAVENOUS
  Filled 2015-08-12: qty 1

## 2015-08-12 MED ORDER — ROCURONIUM BROMIDE 50 MG/5ML IV SOLN
INTRAVENOUS | Status: AC
  Filled 2015-08-12: qty 1

## 2015-08-12 MED ORDER — BUPIVACAINE HCL (PF) 0.25 % IJ SOLN
INTRAMUSCULAR | Status: AC
  Filled 2015-08-12: qty 30

## 2015-08-12 MED ORDER — FENTANYL CITRATE (PF) 250 MCG/5ML IJ SOLN
INTRAMUSCULAR | Status: DC | PRN
  Administered 2015-08-12: 100 ug via INTRAVENOUS
  Administered 2015-08-12 – 2015-08-13 (×2): 50 ug via INTRAVENOUS

## 2015-08-12 MED ORDER — FENTANYL CITRATE (PF) 250 MCG/5ML IJ SOLN
INTRAMUSCULAR | Status: AC
  Filled 2015-08-12: qty 5

## 2015-08-12 MED ORDER — NEOSTIGMINE METHYLSULFATE 10 MG/10ML IV SOLN
INTRAVENOUS | Status: AC
  Filled 2015-08-12: qty 1

## 2015-08-12 MED ORDER — ROCURONIUM BROMIDE 100 MG/10ML IV SOLN
INTRAVENOUS | Status: DC | PRN
  Administered 2015-08-12 (×2): 20 mg via INTRAVENOUS
  Administered 2015-08-12: 30 mg via INTRAVENOUS

## 2015-08-12 MED ORDER — PROPOFOL 10 MG/ML IV BOLUS
INTRAVENOUS | Status: AC
  Filled 2015-08-12: qty 20

## 2015-08-12 MED ORDER — 0.9 % SODIUM CHLORIDE (POUR BTL) OPTIME
TOPICAL | Status: DC | PRN
  Administered 2015-08-12: 1000 mL

## 2015-08-12 MED ORDER — FENTANYL CITRATE (PF) 100 MCG/2ML IJ SOLN
100.0000 ug | Freq: Once | INTRAMUSCULAR | Status: AC
  Administered 2015-08-12: 100 ug via INTRAVENOUS
  Filled 2015-08-12: qty 2

## 2015-08-12 MED ORDER — SODIUM CHLORIDE 0.9 % IV SOLN
1000.0000 mL | Freq: Once | INTRAVENOUS | Status: AC
Start: 2015-08-12 — End: 2015-08-12
  Administered 2015-08-12: 1000 mL via INTRAVENOUS

## 2015-08-12 MED ORDER — CEFAZOLIN SODIUM-DEXTROSE 2-3 GM-% IV SOLR
2.0000 g | Freq: Three times a day (TID) | INTRAVENOUS | Status: DC
  Administered 2015-08-12: 2 g via INTRAVENOUS
  Filled 2015-08-12 (×3): qty 50

## 2015-08-12 MED ORDER — SODIUM CHLORIDE 0.9 % IV SOLN
1000.0000 mL | INTRAVENOUS | Status: DC
  Administered 2015-08-12: 1000 mL via INTRAVENOUS

## 2015-08-12 MED ORDER — GLYCOPYRROLATE 0.2 MG/ML IJ SOLN
INTRAMUSCULAR | Status: AC
  Filled 2015-08-12: qty 3

## 2015-08-12 MED ORDER — LIDOCAINE HCL (CARDIAC) 20 MG/ML IV SOLN
INTRAVENOUS | Status: AC
  Filled 2015-08-12: qty 5

## 2015-08-12 MED ORDER — CEFAZOLIN SODIUM-DEXTROSE 2-3 GM-% IV SOLR
INTRAVENOUS | Status: DC | PRN
  Administered 2015-08-12: 2 g via INTRAVENOUS

## 2015-08-12 MED ORDER — PROPOFOL 10 MG/ML IV BOLUS
INTRAVENOUS | Status: DC | PRN
  Administered 2015-08-12: 180 mg via INTRAVENOUS

## 2015-08-12 MED ORDER — LACTATED RINGERS IV SOLN
INTRAVENOUS | Status: DC | PRN
  Administered 2015-08-12 (×2): via INTRAVENOUS

## 2015-08-12 MED ORDER — ONDANSETRON HCL 4 MG/2ML IJ SOLN
4.0000 mg | Freq: Once | INTRAMUSCULAR | Status: AC
  Administered 2015-08-12: 4 mg via INTRAVENOUS
  Filled 2015-08-12: qty 2

## 2015-08-12 MED ORDER — MIDAZOLAM HCL 2 MG/2ML IJ SOLN
INTRAMUSCULAR | Status: AC
  Filled 2015-08-12: qty 4

## 2015-08-12 MED ORDER — IOHEXOL 300 MG/ML  SOLN
100.0000 mL | Freq: Once | INTRAMUSCULAR | Status: AC | PRN
  Administered 2015-08-12: 100 mL via INTRAVENOUS

## 2015-08-12 MED ORDER — BUPIVACAINE-EPINEPHRINE (PF) 0.5% -1:200000 IJ SOLN
INTRAMUSCULAR | Status: DC | PRN
  Administered 2015-08-12: 30 mL

## 2015-08-12 MED ORDER — SUCCINYLCHOLINE CHLORIDE 20 MG/ML IJ SOLN
INTRAMUSCULAR | Status: DC | PRN
  Administered 2015-08-12: 120 mg via INTRAVENOUS

## 2015-08-12 SURGICAL SUPPLY — 55 items
BLADE SURG 10 STRL SS (BLADE) ×3 IMPLANT
BLADE SURG 15 STRL LF DISP TIS (BLADE) ×1 IMPLANT
BLADE SURG 15 STRL SS (BLADE) ×2
BLADE SURG ROTATE 9660 (MISCELLANEOUS) IMPLANT
CANISTER SUCTION 2500CC (MISCELLANEOUS) IMPLANT
CHLORAPREP W/TINT 26ML (MISCELLANEOUS) ×3 IMPLANT
COVER SURGICAL LIGHT HANDLE (MISCELLANEOUS) ×3 IMPLANT
DECANTER SPIKE VIAL GLASS SM (MISCELLANEOUS) IMPLANT
DRAIN PENROSE 1/2X12 LTX STRL (WOUND CARE) ×3 IMPLANT
DRAPE LAPAROSCOPIC ABDOMINAL (DRAPES) ×3 IMPLANT
DRAPE LAPAROTOMY TRNSV 102X78 (DRAPE) ×3 IMPLANT
ELECT CAUTERY BLADE 6.4 (BLADE) ×3 IMPLANT
ELECT REM PT RETURN 9FT ADLT (ELECTROSURGICAL) ×3
ELECTRODE REM PT RTRN 9FT ADLT (ELECTROSURGICAL) ×1 IMPLANT
GAUZE SPONGE 4X4 12PLY STRL (GAUZE/BANDAGES/DRESSINGS) ×3 IMPLANT
GAUZE SPONGE 4X4 16PLY XRAY LF (GAUZE/BANDAGES/DRESSINGS) ×6 IMPLANT
GLOVE BIO SURGEON STRL SZ7 (GLOVE) ×6 IMPLANT
GLOVE BIOGEL PI IND STRL 6.5 (GLOVE) ×1 IMPLANT
GLOVE BIOGEL PI IND STRL 7.5 (GLOVE) ×2 IMPLANT
GLOVE BIOGEL PI INDICATOR 6.5 (GLOVE) ×2
GLOVE BIOGEL PI INDICATOR 7.5 (GLOVE) ×4
GOWN STRL REUS W/ TWL LRG LVL3 (GOWN DISPOSABLE) ×2 IMPLANT
GOWN STRL REUS W/TWL LRG LVL3 (GOWN DISPOSABLE) ×4
KIT BASIN OR (CUSTOM PROCEDURE TRAY) ×3 IMPLANT
KIT ROOM TURNOVER OR (KITS) ×3 IMPLANT
LIGASURE IMPACT 36 18CM CVD LR (INSTRUMENTS) ×3 IMPLANT
LIQUID BAND (GAUZE/BANDAGES/DRESSINGS) ×3 IMPLANT
MESH HERNIA SYS ULTRAPRO LRG (Mesh General) ×3 IMPLANT
NEEDLE HYPO 25GX1X1/2 BEV (NEEDLE) ×3 IMPLANT
NS IRRIG 1000ML POUR BTL (IV SOLUTION) ×3 IMPLANT
PACK SURGICAL SETUP 50X90 (CUSTOM PROCEDURE TRAY) ×3 IMPLANT
PAD ARMBOARD 7.5X6 YLW CONV (MISCELLANEOUS) ×3 IMPLANT
PEN SKIN MARKING BROAD (MISCELLANEOUS) ×3 IMPLANT
PENCIL BUTTON HOLSTER BLD 10FT (ELECTRODE) ×3 IMPLANT
SPONGE LAP 18X18 X RAY DECT (DISPOSABLE) ×6 IMPLANT
STAPLER VISISTAT 35W (STAPLE) ×3 IMPLANT
SUT MNCRL AB 4-0 PS2 18 (SUTURE) ×3 IMPLANT
SUT SILK 3 0 SH 30 (SUTURE) ×3 IMPLANT
SUT VIC AB 0 CT1 18XCR BRD 8 (SUTURE) ×1 IMPLANT
SUT VIC AB 0 CT1 27 (SUTURE)
SUT VIC AB 0 CT1 27XBRD ANBCTR (SUTURE) IMPLANT
SUT VIC AB 0 CT1 8-18 (SUTURE) ×2
SUT VIC AB 2-0 CT1 27 (SUTURE) ×2
SUT VIC AB 2-0 CT1 TAPERPNT 27 (SUTURE) ×1 IMPLANT
SUT VIC AB 2-0 SH 18 (SUTURE) ×6 IMPLANT
SUT VIC AB 3-0 SH 27 (SUTURE) ×2
SUT VIC AB 3-0 SH 27XBRD (SUTURE) ×1 IMPLANT
SUT VICRYL AB 2 0 TIES (SUTURE) ×6 IMPLANT
SYR CONTROL 10ML LL (SYRINGE) ×3 IMPLANT
TAPE CLOTH SURG 4X10 WHT LF (GAUZE/BANDAGES/DRESSINGS) ×3 IMPLANT
TOWEL OR 17X24 6PK STRL BLUE (TOWEL DISPOSABLE) ×3 IMPLANT
TOWEL OR 17X26 10 PK STRL BLUE (TOWEL DISPOSABLE) ×3 IMPLANT
TUBE CONNECTING 12'X1/4 (SUCTIONS)
TUBE CONNECTING 12X1/4 (SUCTIONS) IMPLANT
YANKAUER SUCT BULB TIP NO VENT (SUCTIONS) IMPLANT

## 2015-08-12 NOTE — ED Notes (Signed)
Pt to ED with c/o sudden onset of left groin and left lower abd approx. 2 1/2 hour ago.  Pt has vomited x's 2 with swelling to left testicle

## 2015-08-12 NOTE — ED Notes (Signed)
OR ready for patient

## 2015-08-12 NOTE — Anesthesia Preprocedure Evaluation (Addendum)
Anesthesia Evaluation  Patient identified by MRN, date of birth, ID band Patient awake    Reviewed: Allergy & Precautions, NPO status , Patient's Chart, lab work & pertinent test results  History of Anesthesia Complications Negative for: history of anesthetic complications  Airway Mallampati: II  TM Distance: <3 FB Neck ROM: Full    Dental  (+) Teeth Intact, Dental Advisory Given   Pulmonary neg pulmonary ROS,  breath sounds clear to auscultation        Cardiovascular negative cardio ROS  Rhythm:Regular Rate:Normal     Neuro/Psych negative neurological ROS  negative psych ROS   GI/Hepatic Neg liver ROS, GERD-  ,  Endo/Other  negative endocrine ROS  Renal/GU negative Renal ROS     Musculoskeletal   Abdominal   Peds  Hematology   Anesthesia Other Findings   Reproductive/Obstetrics                           Anesthesia Physical Anesthesia Plan  ASA: II and emergent  Anesthesia Plan: General   Post-op Pain Management: GA combined w/ Regional for post-op pain   Induction: Intravenous, Rapid sequence and Cricoid pressure planned  Airway Management Planned: Oral ETT  Additional Equipment:   Intra-op Plan:   Post-operative Plan: Extubation in OR  Informed Consent:   Plan Discussed with: CRNA, Anesthesiologist and Surgeon  Anesthesia Plan Comments:         Anesthesia Quick Evaluation

## 2015-08-12 NOTE — H&P (Addendum)
Mark Heath is an 39 y.o. male.   Chief Complaint: left groin pain/scrotal pain/vomiting HPI: 54 yom who presents with acute onset of left groin pain and scrotal mass that occurred several hours ago.  This was associated with emesis. No further though. He had umbilical incisional hernia repaired in 2012 with pvp and at same time a mesh repair (patch) of left inguinal hernia.  This by notes was complicated by large hematoma.  His wife thinks it recurred then but I dont think so. This appears to be acutely incarcerated now.  No fevers.  He also notes bulge returned around his umbilicus also. 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   Past Medical History  Diagnosis Date  . Sinus problem   . Bleeding nose     as a child  . Chicken pox   . GERD (gastroesophageal reflux disease)   . Seasonal allergies     Past Surgical History  Procedure Laterality Date  . Appendectomy  2006  . Tonsillectomy and adenoidectomy    . Hernia repair  july 2012    umbilical & left incarcerated inguinal repair with mesh    Family History  Problem Relation Age of Onset  . Hypertension Mother   . Heart disease Mother 77  . Arthritis Mother   . Hyperlipidemia Mother   . Stroke Mother   . Diverticulitis Mother   . Hypertension Father   . Arthritis Father   . Hyperlipidemia Father   . Diabetes Father   . Dementia Father    Social History:  reports that he has never smoked. He does not have any smokeless tobacco history on file. He reports that he does not drink alcohol or use illicit drugs.  Allergies: No Known Allergies  meds none  Results for orders placed or performed during the hospital encounter of 08/12/15 (from the past 48 hour(s))  CBC WITH DIFFERENTIAL     Status: None   Collection Time: 08/12/15  6:26 PM  Result Value Ref Range   WBC 9.0 4.0 - 10.5 K/uL   RBC 4.65 4.22 - 5.81 MIL/uL   Hemoglobin 15.0 13.0 - 17.0 g/dL   HCT 42.9 39.0 - 52.0 %   MCV 92.3 78.0 - 100.0 fL   MCH 32.3  26.0 - 34.0 pg   MCHC 35.0 30.0 - 36.0 g/dL   RDW 11.9 11.5 - 15.5 %   Platelets 238 150 - 400 K/uL   Neutrophils Relative % 75 43 - 77 %   Neutro Abs 6.8 1.7 - 7.7 K/uL   Lymphocytes Relative 18 12 - 46 %   Lymphs Abs 1.6 0.7 - 4.0 K/uL   Monocytes Relative 7 3 - 12 %   Monocytes Absolute 0.6 0.1 - 1.0 K/uL   Eosinophils Relative 0 0 - 5 %   Eosinophils Absolute 0.0 0.0 - 0.7 K/uL   Basophils Relative 0 0 - 1 %   Basophils Absolute 0.0 0.0 - 0.1 K/uL  Comprehensive metabolic panel     Status: Abnormal   Collection Time: 08/12/15  6:26 PM  Result Value Ref Range   Sodium 139 135 - 145 mmol/L   Potassium 3.7 3.5 - 5.1 mmol/L   Chloride 107 101 - 111 mmol/L   CO2 23 22 - 32 mmol/L   Glucose, Bld 147 (H) 65 - 99 mg/dL   BUN 15 6 - 20 mg/dL   Creatinine, Ser 0.90 0.61 - 1.24 mg/dL   Calcium 9.5 8.9 - 10.3  mg/dL   Total Protein 6.7 6.5 - 8.1 g/dL   Albumin 3.9 3.5 - 5.0 g/dL   AST 28 15 - 41 U/L   ALT 27 17 - 63 U/L   Alkaline Phosphatase 47 38 - 126 U/L   Total Bilirubin 1.3 (H) 0.3 - 1.2 mg/dL   GFR calc non Af Amer >60 >60 mL/min   GFR calc Af Amer >60 >60 mL/min    Comment: (NOTE) The eGFR has been calculated using the CKD EPI equation. This calculation has not been validated in all clinical situations. eGFR's persistently <60 mL/min signify possible Chronic Kidney Disease.    Anion gap 9 5 - 15  Lipase, blood     Status: Abnormal   Collection Time: 08/12/15  6:26 PM  Result Value Ref Range   Lipase 20 (L) 22 - 51 U/L  I-Stat CG4 Lactic Acid, ED  (not at [facility])     Status: None   Collection Time: 08/12/15  6:43 PM  Result Value Ref Range   Lactic Acid, Venous 1.34 0.5 - 2.0 mmol/L   Ct Abdomen Pelvis W Contrast  08/12/2015   CLINICAL DATA:  Left groin pain onset today. Pain extending into the scrotum. Evaluate for incarcerated hernia. History of appendectomy. Initial encounter.  EXAM: CT ABDOMEN AND PELVIS WITH CONTRAST  TECHNIQUE: Multidetector CT imaging of the  abdomen and pelvis was performed using the standard protocol following bolus administration of intravenous contrast.  CONTRAST:  147m OMNIPAQUE IOHEXOL 300 MG/ML  SOLN  COMPARISON:  Radiographs 07/06/2011.  CT 06/06/2005.  FINDINGS: Lower chest: Mild dependent atelectasis in both lower lobes and within the right middle lobe. No significant pleural or pericardial effusion.  Hepatobiliary: The liver is normal in density without focal abnormality. Dependent sludge or small gallstones in the gallbladder neck. No gallbladder wall thickening or biliary dilatation.  Pancreas: Unremarkable. No pancreatic ductal dilatation or surrounding inflammatory changes.  Spleen: Normal in size without focal abnormality.  Adrenals/Urinary Tract: Both adrenal glands appear normal. The kidneys appear normal without evidence of urinary tract calculus, suspicious lesion or hydronephrosis. No bladder abnormalities are seen.  Stomach/Bowel: No evidence of bowel wall thickening, distention or surrounding inflammatory change. Appendix not well seen. There is a large left inguinal hernia containing fat and a portion of the sigmoid colon. There is no proximal bowel distension or signs of incarceration.  Vascular/Lymphatic: There are no enlarged abdominal or pelvic lymph nodes. Mild aortoiliac atherosclerosis.  Reproductive: Unremarkable.  Other: As above, there is a large left inguinal hernia containing a portion of the sigmoid colon. No evidence of incarceration. This hernia measures up to 9.5 cm transverse. There is also a right periumbilical hernia containing mesenteric fat, measuring up to 7.2 cm transverse on image 54.  Musculoskeletal: No acute or significant osseous findings. Asymmetric right hip degenerative changes noted.  IMPRESSION: 1. Large left inguinal hernia containing fat and sigmoid colon. No specific signs of incarceration. No evidence of bowel obstruction. 2. Right periumbilical hernia containing only fat. 3. Possible small  gallstones versus sludge. No evidence of gallbladder wall thickening or biliary dilatation.   Electronically Signed   By: WRichardean SaleM.D.   On: 08/12/2015 20:20    Review of Systems  Constitutional: Negative for fever and chills.  Respiratory: Negative for shortness of breath.   Cardiovascular: Negative for chest pain.  Gastrointestinal: Positive for nausea, vomiting and abdominal pain. Negative for diarrhea.  Genitourinary: Negative for dysuria and urgency.    Blood pressure 122/76, pulse  68, temperature 97.6 F (36.4 C), temperature source Oral, resp. rate 16, height 5' 10"  (1.778 m), weight 81.647 kg (180 lb), SpO2 98 %. Physical Exam  Vitals reviewed. Constitutional: He appears well-developed and well-nourished.  Eyes: No scleral icterus.  Neck: Neck supple.  Cardiovascular: Normal rate, regular rhythm and normal heart sounds.   Respiratory: Effort normal and breath sounds normal. He has no wheezes. He has no rales.  GI: Soft. Bowel sounds are normal. He exhibits no distension. There is no tenderness. A hernia is present. Hernia confirmed positive in the ventral area and confirmed positive in the left inguinal area (large scrotal lih that is tender to palpation, well healed left groin scar). Hernia confirmed negative in the right inguinal area.       Assessment/Plan Incarcerated LIH  I think this needs to be repaired tonight due to acute incarceration. I discussed possibility of bowel resection and even colostomy although I dont think this will be the case. Will plan on left groin incision with hopefully mesh repair. I described the procedure in detail.   We discussed the usage of mesh and the rationale behind that. We went over the pathophysiology of inguinal hernia and the issue with scarring and mesh from prior repair.\ We discussed the risks including bleeding, infection, recurrence (which i told him was at least 10%), postoperative pain and chronic groin pain, testicular  injury and possible removal, urinary retention, numbness in groin and around incision. He and his wife are both agreeable to proceed tonight  Will need recurrent ventral hernia evaluated later for repair.     Fordyce Lepak 08/12/2015, 9:36 PM

## 2015-08-12 NOTE — ED Provider Notes (Signed)
CSN: [id number]     Arrival date & time 08/12/15  1748 History   First MD Initiated Contact with Patient 08/12/15 1836     Chief Complaint  Patient presents with  . Abdominal Pain     (Consider location/radiation/quality/duration/timing/severity/associated sxs/prior Treatment) HPI Patient presents with the sudden onset of scrotal swelling, pain. Symptoms began about 2.5 hours ago. Since that time there's been increasing severe pain in the left testicle, left lower abdomen. There is associated nausea, vomiting. I last bowel movement was yesterday. Dysuria, hematuria recently. Patient has a notable history of prior appendectomy, complicated by hernia formation, and subsequent hernia repair.  Since onset of pain, no clear alleviating, exacerbating factors.   Past Medical History  Diagnosis Date  . Sinus problem   . Bleeding nose     as a child  . Chicken pox   . GERD (gastroesophageal reflux disease)   . Seasonal allergies    Past Surgical History  Procedure Laterality Date  . Appendectomy  2006  . Tonsillectomy and adenoidectomy    . Hernia repair  july 2012    umbilical & left incarcerated inguinal repair with mesh   Family History  Problem Relation Age of Onset  . Hypertension Mother   . Heart disease Mother 65  . Arthritis Mother   . Hyperlipidemia Mother   . Stroke Mother   . Diverticulitis Mother   . Hypertension Father   . Arthritis Father   . Hyperlipidemia Father   . Diabetes Father   . Dementia Father    Social History  Substance Use Topics  . Smoking status: Never Smoker   . Smokeless tobacco: None  . Alcohol Use: No    Review of Systems  Constitutional:       Per HPI, otherwise negative  HENT:       Per HPI, otherwise negative  Respiratory:       Per HPI, otherwise negative  Cardiovascular:       Per HPI, otherwise negative  Gastrointestinal: Positive for nausea and vomiting.  Endocrine:       Negative aside from HPI  Genitourinary:     Neg aside from HPI   Musculoskeletal:       Per HPI, otherwise negative  Skin: Negative.   Neurological: Negative for syncope.      Allergies  Review of patient's allergies indicates no known allergies.  Home Medications   Prior to Admission medications   Medication Sig Start Date End Date Taking? Authorizing Provider  azithromycin (ZITHROMAX) 250 MG tablet Take 1 tablet (250 mg total) by mouth daily. Take first 2 tablets together, then 1 every day until finished. 11/22/13   Rodolph Bong, MD  guaiFENesin-codeine 100-10 MG/5ML syrup Take 5 mLs by mouth at bedtime as needed for cough. 11/22/13   Rodolph Bong, MD  ipratropium (ATROVENT) 0.06 % nasal spray Place 2 sprays into both nostrils 4 (four) times daily. 11/22/13   Rodolph Bong, MD   BP 129/83 mmHg  Pulse 74  Temp(Src) 97.6 F (36.4 C) (Oral)  Resp 18  Ht  (1.778 m)  Wt 180 lb (81.647 kg)  BMI 25.83 kg/m2  SpO2 100% Physical Exam  Constitutional: He is oriented to person, place, and time. He appears well-developed. No distress.  HENT:  Head: Normocephalic and atraumatic.  Eyes: Conjunctivae and EOM are normal.  Cardiovascular: Normal rate and regular rhythm.   Pulmonary/Chest: Effort normal. No stridor. No respiratory distress.  Abdominal: He exhibits no distension.  Genitourinary:     Musculoskeletal: He exhibits no edema.  Neurological: He is alert and oriented to person, place, and time.  Skin: Skin is warm and dry.  Psychiatric: He has a normal mood and affect.  Nursing note and vitals reviewed.   ED Course  Procedures (including critical care time) Labs Review Labs Reviewed  CBC WITH DIFFERENTIAL/PLATELET  COMPREHENSIVE METABOLIC PANEL  LIPASE, BLOOD  I-STAT CG4 LACTIC ACID, ED    Imaging Review No results found. I have personally reviewed and evaluated these images and lab results as part of my medical decision-making.   8:49 PM Patient in no distress, awake, alert. Pain is  diminished somewhat. No new complaints. He and his wife are aware of all findings.  I discussed the patient's findings with our surgical colleagues for evaluation. Hernia remains irreducible.  Ice pack applied  I discussed patient's case with our surgical colleagues. The patient will be taken to the OR for operative repair. MDM  Patient presents to the acute onset of scrotal pain and swelling. Given the notable enlargement, incarcerated hernia was a consideration. Physical exam findings were less concerning for torsion. CT scan demonstrates irreducible hernia containing sigmoid colon. Patient required operative repair.  Gerhard Munch, MD 08/13/15 Marlyne Beards

## 2015-08-12 NOTE — ED Notes (Signed)
Chey Cho, wife to pt, to be contacted as needed [phone] or [phone]

## 2015-08-12 NOTE — Anesthesia Procedure Notes (Addendum)
Anesthesia Regional Block:  TAP block  Pre-Anesthetic Checklist: ,, timeout performed, Correct Patient, Correct Site, Correct Laterality, Correct Procedure, Correct Position, site marked, Risks and benefits discussed,  Surgical consent,  Pre-op evaluation,  At surgeon's request and post-op pain management  Laterality: Left  Prep: chloraprep       Needles:  Injection technique: Single-shot  Needle Type: Echogenic Stimulator Needle     Needle Length: 10cm 10 cm Needle Gauge: 21 and 21 G    Additional Needles:  Procedures: ultrasound guided (picture in chart) TAP block Narrative:  Start time: 08/12/2015 10:00 PM End time: 08/12/2015 11:09 PM Injection made incrementally with aspirations every 5 mL.  Performed by: Personally    Procedure Name: Intubation Date/Time: 08/12/2015 10:34 PM Performed by: Molli Hazard Pre-anesthesia Checklist: Patient identified, Emergency Drugs available, Suction available and Patient being monitored Patient Re-evaluated:Patient Re-evaluated prior to inductionOxygen Delivery Method: Circle system utilized Preoxygenation: Pre-oxygenation with 100% oxygen Intubation Type: IV induction, Rapid sequence and Cricoid Pressure applied Laryngoscope Size: Miller and 2 Grade View: Grade II Tube type: Oral Tube size: 7.5 mm Number of attempts: 1 Airway Equipment and Method: Stylet Placement Confirmation: ETT inserted through vocal cords under direct vision,  positive ETCO2 and breath sounds checked- equal and bilateral Secured at: 23 cm Tube secured with: Tape Dental Injury: Teeth and Oropharynx as per pre-operative assessment

## 2015-08-13 ENCOUNTER — Encounter (HOSPITAL_COMMUNITY): Payer: Self-pay | Admitting: General Surgery

## 2015-08-13 DIAGNOSIS — K567 Ileus, unspecified: Secondary | ICD-10-CM | POA: Diagnosis not present

## 2015-08-13 DIAGNOSIS — Z823 Family history of stroke: Secondary | ICD-10-CM | POA: Diagnosis not present

## 2015-08-13 DIAGNOSIS — K4030 Unilateral inguinal hernia, with obstruction, without gangrene, not specified as recurrent: Secondary | ICD-10-CM | POA: Diagnosis present

## 2015-08-13 DIAGNOSIS — R319 Hematuria, unspecified: Secondary | ICD-10-CM | POA: Diagnosis present

## 2015-08-13 DIAGNOSIS — K4031 Unilateral inguinal hernia, with obstruction, without gangrene, recurrent: Secondary | ICD-10-CM | POA: Diagnosis present

## 2015-08-13 DIAGNOSIS — I861 Scrotal varices: Secondary | ICD-10-CM | POA: Diagnosis present

## 2015-08-13 DIAGNOSIS — N508 Other specified disorders of male genital organs: Secondary | ICD-10-CM | POA: Diagnosis present

## 2015-08-13 DIAGNOSIS — K219 Gastro-esophageal reflux disease without esophagitis: Secondary | ICD-10-CM | POA: Diagnosis present

## 2015-08-13 DIAGNOSIS — Z8249 Family history of ischemic heart disease and other diseases of the circulatory system: Secondary | ICD-10-CM | POA: Diagnosis not present

## 2015-08-13 DIAGNOSIS — Z833 Family history of diabetes mellitus: Secondary | ICD-10-CM | POA: Diagnosis not present

## 2015-08-13 DIAGNOSIS — R300 Dysuria: Secondary | ICD-10-CM | POA: Diagnosis present

## 2015-08-13 MED ORDER — ONDANSETRON HCL 4 MG/2ML IJ SOLN
4.0000 mg | Freq: Four times a day (QID) | INTRAMUSCULAR | Status: DC | PRN
  Administered 2015-08-13: 4 mg via INTRAVENOUS
  Filled 2015-08-13: qty 2

## 2015-08-13 MED ORDER — ENOXAPARIN SODIUM 40 MG/0.4ML ~~LOC~~ SOLN
40.0000 mg | SUBCUTANEOUS | Status: DC
  Administered 2015-08-14 – 2015-08-16 (×3): 40 mg via SUBCUTANEOUS
  Filled 2015-08-13 (×3): qty 0.4

## 2015-08-13 MED ORDER — PROMETHAZINE HCL 25 MG/ML IJ SOLN: 6.2500 mg | INTRAMUSCULAR | Status: DC | PRN

## 2015-08-13 MED ORDER — METHOCARBAMOL 500 MG PO TABS
500.0000 mg | ORAL_TABLET | Freq: Four times a day (QID) | ORAL | Status: DC | PRN
  Administered 2015-08-13 – 2015-08-16 (×2): 500 mg via ORAL
  Filled 2015-08-13 (×2): qty 1

## 2015-08-13 MED ORDER — HYDROMORPHONE HCL 1 MG/ML IJ SOLN
1.0000 mg | INTRAMUSCULAR | Status: DC | PRN
  Administered 2015-08-13 – 2015-08-15 (×6): 1 mg via INTRAVENOUS
  Filled 2015-08-13 (×6): qty 1

## 2015-08-13 MED ORDER — ACETAMINOPHEN 650 MG RE SUPP: 650.0000 mg | Freq: Four times a day (QID) | RECTAL | Status: DC | PRN

## 2015-08-13 MED ORDER — KCL IN DEXTROSE-NACL 20-5-0.45 MEQ/L-%-% IV SOLN
INTRAVENOUS | Status: DC
  Administered 2015-08-13 – 2015-08-15 (×4): via INTRAVENOUS
  Filled 2015-08-13 (×4): qty 1000

## 2015-08-13 MED ORDER — KETOROLAC TROMETHAMINE 15 MG/ML IJ SOLN: 15.0000 mg | Freq: Three times a day (TID) | INTRAMUSCULAR | Status: AC | PRN

## 2015-08-13 MED ORDER — HYDROMORPHONE HCL 1 MG/ML IJ SOLN: 0.2500 mg | INTRAMUSCULAR | Status: DC | PRN

## 2015-08-13 MED ORDER — ONDANSETRON HCL 4 MG/2ML IJ SOLN
INTRAMUSCULAR | Status: DC | PRN
  Administered 2015-08-13: 4 mg via INTRAVENOUS

## 2015-08-13 MED ORDER — GLYCOPYRROLATE 0.2 MG/ML IJ SOLN
INTRAMUSCULAR | Status: DC | PRN
  Administered 2015-08-13: .8 mg via INTRAVENOUS

## 2015-08-13 MED ORDER — NEOSTIGMINE METHYLSULFATE 10 MG/10ML IV SOLN
INTRAVENOUS | Status: DC | PRN
  Administered 2015-08-13: 5 mg via INTRAVENOUS

## 2015-08-13 MED ORDER — IPRATROPIUM BROMIDE 0.06 % NA SOLN
2.0000 | Freq: Four times a day (QID) | NASAL | Status: DC
  Administered 2015-08-13 (×3): 2 via NASAL
  Filled 2015-08-13: qty 15

## 2015-08-13 MED ORDER — ONDANSETRON 4 MG PO TBDP: 4.0000 mg | ORAL_TABLET | Freq: Four times a day (QID) | ORAL | Status: DC | PRN

## 2015-08-13 MED ORDER — SODIUM CHLORIDE 0.9 % IV SOLN
INTRAVENOUS | Status: DC
Start: 2015-08-13 — End: 2015-08-13

## 2015-08-13 MED ORDER — ACETAMINOPHEN 325 MG PO TABS
650.0000 mg | ORAL_TABLET | Freq: Four times a day (QID) | ORAL | Status: DC | PRN
  Administered 2015-08-13: 650 mg via ORAL
  Filled 2015-08-13: qty 2

## 2015-08-13 MED ORDER — OXYCODONE HCL 5 MG PO TABS
5.0000 mg | ORAL_TABLET | ORAL | Status: DC | PRN
  Administered 2015-08-14 – 2015-08-16 (×5): 10 mg via ORAL
  Filled 2015-08-13 (×5): qty 2

## 2015-08-13 NOTE — Transfer of Care (Signed)
Immediate Anesthesia Transfer of Care Note  Patient: Mark Heath  Procedure(s) Performed: Procedure(s):  INCARCERATED HERNIA  INGUINAL REPAIR WITH MESH (Left)  Patient Location: PACU  Anesthesia Type:General  Level of Consciousness: awake and sedated  Airway & Oxygen Therapy: Patient Spontanous Breathing  Post-op Assessment: Report given to RN, Post -op Vital signs reviewed and stable and Patient moving all extremities X 4  Post vital signs: Reviewed and stable  Last Vitals:  Filed Vitals:   08/13/15 0033  BP: 129/87  Pulse: 84  Temp: 37.1 C  Resp: 20    Complications: No apparent anesthesia complications

## 2015-08-13 NOTE — Progress Notes (Signed)
1 Day Post-Op  Subjective: Some pain, tol some water  Objective: Vital signs in last 24 hours: Temp:  [97.6 F (36.4 C)-99.2 F (37.3 C)] 98.4 F (36.9 C) (08/31 0748) Pulse Rate:  [60-84] 73 (08/31 0748) Resp:  [16-22] 16 (08/31 0748) BP: (122-131)/(69-87) 122/69 mmHg (08/31 0748) SpO2:  [90 %-100 %] 96 % (08/31 0748) Weight:  [81.647 kg (180 lb)] 81.647 kg (180 lb) (08/30 1809) Last BM Date: 08/12/15  Intake/Output from previous day: 08/30 0701 - 08/31 0700 In: 2220 [P.O.:120; I.V.:2100] Out: 975 [Urine:975] Intake/Output this shift:    GI: dressing dry, scrotum swollen as expected, approp tender  Lab Results:   Recent Labs  08/12/15 1826  WBC 9.0  HGB 15.0  HCT 42.9  PLT 238   BMET  Recent Labs  08/12/15 1826  NA 139  K 3.7  CL 107  CO2 23  GLUCOSE 147*  BUN 15  CREATININE 0.90  CALCIUM 9.5   PT/INR No results for input(s): LABPROT, INR in the last 72 hours. ABG No results for input(s): PHART, HCO3 in the last 72 hours.  Invalid input(s): PCO2, PO2  Studies/Results: Ct Abdomen Pelvis W Contrast  08/12/2015   CLINICAL DATA:  Left groin pain onset today. Pain extending into the scrotum. Evaluate for incarcerated hernia. History of appendectomy. Initial encounter.  EXAM: CT ABDOMEN AND PELVIS WITH CONTRAST  TECHNIQUE: Multidetector CT imaging of the abdomen and pelvis was performed using the standard protocol following bolus administration of intravenous contrast.  CONTRAST:  OMNIPAQUE IOHEXOL 300 MG/ML  SOLN  COMPARISON:  Radiographs 07/06/2011.  CT 06/06/2005.  FINDINGS: Lower chest: Mild dependent atelectasis in both lower lobes and within the right middle lobe. No significant pleural or pericardial effusion.  Hepatobiliary: The liver is normal in density without focal abnormality. Dependent sludge or small gallstones in the gallbladder neck. No gallbladder wall thickening or biliary dilatation.  Pancreas: Unremarkable. No pancreatic ductal  dilatation or surrounding inflammatory changes.  Spleen: Normal in size without focal abnormality.  Adrenals/Urinary Tract: Both adrenal glands appear normal. The kidneys appear normal without evidence of urinary tract calculus, suspicious lesion or hydronephrosis. No bladder abnormalities are seen.  Stomach/Bowel: No evidence of bowel wall thickening, distention or surrounding inflammatory change. Appendix not well seen. There is a large left inguinal hernia containing fat and a portion of the sigmoid colon. There is no proximal bowel distension or signs of incarceration.  Vascular/Lymphatic: There are no enlarged abdominal or pelvic lymph nodes. Mild aortoiliac atherosclerosis.  Reproductive: Unremarkable.  Other: As above, there is a large left inguinal hernia containing a portion of the sigmoid colon. No evidence of incarceration. This hernia measures up to 9.5 cm transverse. There is also a right periumbilical hernia containing mesenteric fat, measuring up to 7.2 cm transverse on image 54.  Musculoskeletal: No acute or significant osseous findings. Asymmetric right hip degenerative changes noted.  IMPRESSION: 1. Large left inguinal hernia containing fat and sigmoid colon. No specific signs of incarceration. No evidence of bowel obstruction. 2. Right periumbilical hernia containing only fat. 3. Possible small gallstones versus sludge. No evidence of gallbladder wall thickening or biliary dilatation.   Electronically Signed   By: Carey Bullocks M.D.   On: 08/12/2015 20:20    Anti-infectives: Anti-infectives    Start     Dose/Rate Route Frequency Ordered Stop   08/12/15 2200  ceFAZolin (ANCEF) IVPB 2 g/50 mL premix  Status:  Discontinued     2 g 100 mL/hr over 30  Minutes Intravenous 3 times per day 08/12/15 2129 08/13/15 0124      Assessment/Plan: POD 1 incarcerated recurrent lih repair  1. Po pain meds with iv backup 2 pulm toilet, oob today 3. Advance diet as tolerated 4. Dc foley today 5.  Lovenox, scds 6. Likely dc tomorrow   [facility] 08/13/2015

## 2015-08-13 NOTE — Op Note (Signed)
Preoperative diagnosis: Recurrent incarcerated left inguinal hernia Postoperative diagnosis: Same as above Procedure: Repair of recurrent incarcerated left inguinal hernia with ultra Pro mesh, resection of omentum Surgeon: Dr. Harden Mo Anesthesia: Gen. With a tap block Estimated blood loss: 50 mL Specimens: #1 omentum #2 hernia sac Complications: None Drains: None Sponge and needle count was correct at completion Disposition to recovery stable  Indications: This is a healthy 39 year old male who in 2012 had a mesh repair of a left groin hernia as well as a umbilical incisional hernia. Both of these have recurred. Today he began to have acute pain and swelling in his left hemiscrotum. He presented and had an incarcerated hernia with a CT scan prior to my seeing him with sigmoid colon present. He was tender on his examination and this was unable to be reduced. We discussed with him going to the operating room. I specifically discussed with him the risk of recurrence as well as testicular loss.  Procedure: After informed consent was obtained the patient was taken to the operating room. He was given antibiotics and had sequential compression devices on his legs. He was placed under general anesthesia without complication. He was prepped and draped in the standard sterile surgical fashion. A surgical timeout was then performed.  I used his prior left groin incision although this was low. I extended this laterally as well as medially. I was able to eventually identify what appeared to be the mesh. Initially had gone lower than this and I then identified the femoral vein and artery. I then went above and with that was able to identify the external oblique which was incised sharply. There was a lot of scarring in this entire area making this a very difficult dissection. Eventually I was able to free the entire oblique. He had basically pulled his mesh entirely away from the floor and the mesh was  mostly stuck to his shelving edge. He had a very large defect. Eventually I was able to release the scar tissue as well as the remnants of the mesh all the way onto the pubic tubercle.  I was then able to reduce a small portion of the sigmoid colon as well as a large portion of omentum some of which was very inflamed and dusky. I elected to excise the omentum with the LigaSure device. The sigmoid colon and a small serosal tear and I repaired this with a 3-0 silk suture. I then placed all of this back into the abdomen. I was able to identify some remnants of the spermatic cord. He had a varicocele preoperatively on his examination as well as what he told me. I think some of this blood supplies been compromised previously. I'm not sure that his testicle is going to survive the operation. I did however place all of the abdominal contents back. I then elected use a large ultra pro hernia system. I placed the bottom portion of the bilayer in the preperitoneal space. This completely had all of the hernia contents reduced. I then closed his old mesh superiorly down to a shelving edge medially. I then laid the top portion of the bilayer flat. I sutured this into several positions with 2-0 Vicryl to the pubic tubercle. I made a cut and wrapped around the remnants of the spermatic cord. I then sutured this in numerous places to the shelving edge with 2-0 Vicryl. I tacked the superiorly as well. The lateral portion was laid flat and tacked with 2-0 Vicryl. This appeared to completely obliterate  the defect. With Valsalva it was all intact. I obtained hemostasis. I then closed the external oblique to some remnants of mesh overlying this. I then closed the dermis with 3-0 Vicryl. The skin was closed with staples. He tolerated this well. He was extubated and transferred to recovery stable.

## 2015-08-13 NOTE — Anesthesia Postprocedure Evaluation (Signed)
Anesthesia Post Note  Patient: Mark Heath  Procedure(s) Performed: Procedure(s) (LRB):  INCARCERATED HERNIA  INGUINAL REPAIR WITH MESH (Left)  Anesthesia type: general  Patient location: PACU  Post pain: Pain level controlled  Post assessment: Patient's Cardiovascular Status Stable  Last Vitals:  Filed Vitals:   08/13/15 0444  BP: 130/79  Pulse:   Temp: 36.8 C  Resp: 20    Post vital signs: Reviewed and stable  Level of consciousness: sedated  Complications: No apparent anesthesia complications

## 2015-08-14 NOTE — Progress Notes (Signed)
2 Days Post-Op  Subjective: Complains of incisional,scrotal pain. Not passing flatus yet  Objective: Vital signs in last 24 hours: Temp:  [98.1 F (36.7 C)-99.2 F (37.3 C)] 98.7 F (37.1 C) (09/01 0540) Pulse Rate:  [68-91] 91 (09/01 0540) Resp:  [17-18] 17 (09/01 0540) BP: (101-108)/(57-69) 108/69 mmHg (09/01 0540) SpO2:  [95 %-99 %] 96 % (09/01 0540) Last BM Date: 08/11/15  Intake/Output from previous day: 08/31 0701 - 09/01 0700 In: 2134.4 [P.O.:520; I.V.:1614.4] Out: 3225 [Urine:3225] Intake/Output this shift:    Abdomen soft, scrotum swollen, tender  Lab Results:   Recent Labs  08/12/15 1826  WBC 9.0  HGB 15.0  HCT 42.9  PLT 238   BMET  Recent Labs  08/12/15 1826  NA 139  K 3.7  CL 107  CO2 23  GLUCOSE 147*  BUN 15  CREATININE 0.90  CALCIUM 9.5   PT/INR No results for input(s): LABPROT, INR in the last 72 hours. ABG No results for input(s): PHART, HCO3 in the last 72 hours.  Invalid input(s): PCO2, PO2  Studies/Results: Ct Abdomen Pelvis W Contrast  08/12/2015   CLINICAL DATA:  Left groin pain onset today. Pain extending into the scrotum. Evaluate for incarcerated hernia. History of appendectomy. Initial encounter.  EXAM: CT ABDOMEN AND PELVIS WITH CONTRAST  TECHNIQUE: Multidetector CT imaging of the abdomen and pelvis was performed using the standard protocol following bolus administration of intravenous contrast.  CONTRAST:  OMNIPAQUE IOHEXOL 300 MG/ML  SOLN  COMPARISON:  Radiographs 07/06/2011.  CT 06/06/2005.  FINDINGS: Lower chest: Mild dependent atelectasis in both lower lobes and within the right middle lobe. No significant pleural or pericardial effusion.  Hepatobiliary: The liver is normal in density without focal abnormality. Dependent sludge or small gallstones in the gallbladder neck. No gallbladder wall thickening or biliary dilatation.  Pancreas: Unremarkable. No pancreatic ductal dilatation or surrounding inflammatory changes.   Spleen: Normal in size without focal abnormality.  Adrenals/Urinary Tract: Both adrenal glands appear normal. The kidneys appear normal without evidence of urinary tract calculus, suspicious lesion or hydronephrosis. No bladder abnormalities are seen.  Stomach/Bowel: No evidence of bowel wall thickening, distention or surrounding inflammatory change. Appendix not well seen. There is a large left inguinal hernia containing fat and a portion of the sigmoid colon. There is no proximal bowel distension or signs of incarceration.  Vascular/Lymphatic: There are no enlarged abdominal or pelvic lymph nodes. Mild aortoiliac atherosclerosis.  Reproductive: Unremarkable.  Other: As above, there is a large left inguinal hernia containing a portion of the sigmoid colon. No evidence of incarceration. This hernia measures up to 9.5 cm transverse. There is also a right periumbilical hernia containing mesenteric fat, measuring up to 7.2 cm transverse on image 54.  Musculoskeletal: No acute or significant osseous findings. Asymmetric right hip degenerative changes noted.  IMPRESSION: 1. Large left inguinal hernia containing fat and sigmoid colon. No specific signs of incarceration. No evidence of bowel obstruction. 2. Right periumbilical hernia containing only fat. 3. Possible small gallstones versus sludge. No evidence of gallbladder wall thickening or biliary dilatation.   Electronically Signed   By: Carey Bullocks M.D.   On: 08/12/2015 20:20    Anti-infectives: Anti-infectives    Start     Dose/Rate Route Frequency Ordered Stop   08/12/15 2200  ceFAZolin (ANCEF) IVPB 2 g/50 mL premix  Status:  Discontinued     2 g 100 mL/hr over 30 Minutes Intravenous 3 times per day 08/12/15 2129 08/13/15 0124  Assessment/Plan: s/p Procedure(s):  INCARCERATED HERNIA  INGUINAL REPAIR WITH MESH (Left)  Continue pain control Incentive spirometer Fluid in the scrotum is expected  LOS: 1 day    Mark Heath  A 08/14/2015

## 2015-08-15 MED ORDER — ACETAMINOPHEN 325 MG PO TABS: 650.0000 mg | ORAL_TABLET | Freq: Four times a day (QID) | ORAL | Status: DC | PRN

## 2015-08-15 MED ORDER — METHOCARBAMOL 500 MG PO TABS: 1000.0000 mg | ORAL_TABLET | Freq: Three times a day (TID) | ORAL | Status: DC | PRN

## 2015-08-15 MED ORDER — DOXYCYCLINE HYCLATE 100 MG PO TABS: 100.0000 mg | ORAL_TABLET | Freq: Two times a day (BID) | ORAL | Status: DC

## 2015-08-15 MED ORDER — OXYCODONE HCL 5 MG PO TABS: 5.0000 mg | ORAL_TABLET | Freq: Four times a day (QID) | ORAL | Status: DC | PRN

## 2015-08-15 MED ORDER — DOXYCYCLINE HYCLATE 100 MG PO TABS
100.0000 mg | ORAL_TABLET | Freq: Two times a day (BID) | ORAL | Status: DC
  Administered 2015-08-15 – 2015-08-16 (×3): 100 mg via ORAL
  Filled 2015-08-15 (×3): qty 1

## 2015-08-15 NOTE — Progress Notes (Signed)
[facility] Progress Note  3 Days Post-Op  Subjective: Pt urinating well, no BM yet, but having flatus.  C/o drainage from wound.  C/o swollen scrotum on right and swelling to the incision site.  Wife says he got up and got very dizzy and almost passed out while going to the bathroom.  Pain pretty well controlled.  No N/V, but appetite low.  Ambulating well with assistance.  Objective: Vital signs in last 24 hours: Temp:  [98.4 F (36.9 C)-99.7 F (37.6 C)] 99.7 F (37.6 C) (09/02 0441) Pulse Rate:  [88-97] 88 (09/02 0441) Resp:  [16-18] 16 (09/02 0441) BP: (104-110)/(64-73) 106/65 mmHg (09/02 0441) SpO2:  [97 %-98 %] 97 % (09/02 0441) Last BM Date: 08/11/15  Intake/Output from previous day: 09/01 0701 - 09/02 0700 In: 1808 [P.O.:660; I.V.:1148] Out: 1875 [Urine:1875] Intake/Output this shift:    PE: Gen:  Alert, NAD, pleasant Abd: Soft, mildly distended, tender in the lower abdomen and over left groin and scrotum, +BS, no HSM, incisions with sanguinous drainage, staples are somewhat loose on the skin, some barely in place, sanguinous drainage.  Significant swelling to left incision and scrotum.     Lab Results:   Recent Labs  08/12/15 1826  WBC 9.0  HGB 15.0  HCT 42.9  PLT 238   BMET  Recent Labs  08/12/15 1826  NA 139  K 3.7  CL 107  CO2 23  GLUCOSE 147*  BUN 15  CREATININE 0.90  CALCIUM 9.5   PT/INR No results for input(s): LABPROT, INR in the last 72 hours. CMP     Component Value Date/Time   NA 139 08/12/2015 1826   K 3.7 08/12/2015 1826   CL 107 08/12/2015 1826   CO2 23 08/12/2015 1826   GLUCOSE 147* 08/12/2015 1826   BUN 15 08/12/2015 1826   CREATININE 0.90 08/12/2015 1826   CALCIUM 9.5 08/12/2015 1826   PROT 6.7 08/12/2015 1826   ALBUMIN 3.9 08/12/2015 1826   AST 28 08/12/2015 1826   ALT 27 08/12/2015 1826   ALKPHOS 47 08/12/2015 1826   BILITOT 1.3* 08/12/2015 1826   GFRNONAA >60 08/12/2015 1826   GFRAA >60 08/12/2015  1826   Lipase     Component Value Date/Time   LIPASE 20* 08/12/2015 1826       Studies/Results: No results found.  Anti-infectives: Anti-infectives    Start     Dose/Rate Route Frequency Ordered Stop   08/12/15 2200  ceFAZolin (ANCEF) IVPB 2 g/50 mL premix  Status:  Discontinued     2 g 100 mL/hr over 30 Minutes Intravenous 3 times per day 08/12/15 2129 08/13/15 0124       Assessment/Plan POD #3 incarcerated recurrent Left inguinal hernia repair  1. Po pain meds 2  Pulm toilet, oob today 3. On soft diet 4. Lovenox, scds 5. Fluid in scrotum is expected, will need scrotal support. 6. Discussed with Dr. Magnus Ivan, will likely need to d/c home with doxycycline for preventative wound infection given how indurated his wound is currently and pending upcoming long weekend 7. Get orthostatics vitals, continue IVF for now.    LOS: 2 days    Nonie Hoyer 08/15/2015, 9:29 AM Pager: [phone]

## 2015-08-15 NOTE — Discharge Instructions (Signed)
[facility]  UMBILICAL OR INGUINAL HERNIA REPAIR: POST OP INSTRUCTIONS  Always review your discharge instruction sheet given to you by the facility where your surgery was performed. IF YOU HAVE DISABILITY OR FAMILY LEAVE FORMS, YOU MUST BRING THEM TO THE OFFICE FOR PROCESSING.   DO NOT GIVE THEM TO YOUR DOCTOR.  1. A  prescription for pain medication may be given to you upon discharge.  Take your pain medication as prescribed, if needed.  If narcotic pain medicine is not needed, then you may take acetaminophen (Tylenol) or ibuprofen (Advil) as needed. 2. Take your usually prescribed medications unless otherwise directed. 3. If you need a refill on your pain medication, please contact your pharmacy.  They will contact our office to request authorization. Prescriptions will not be filled after 5 pm or on week-ends. 4. You should follow a light diet the first 24 hours after arrival home, such as soup and crackers, etc.  Be sure to include lots of fluids daily.  Resume your normal diet the day after surgery. 5. Most patients will experience some swelling and bruising around the umbilicus or in the groin and scrotum.  Ice packs and reclining will help.  Swelling and bruising can take several days to resolve.  6. It is common to experience some constipation if taking pain medication after surgery.  Increasing fluid intake and taking a stool softener (such as Colace) will usually help or prevent this problem from occurring.  A mild laxative (Milk of Magnesia or Miralax) should be taken according to package directions if there are no bowel movements after 48 hours. 7. Unless discharge instructions indicate otherwise, you may remove your bandages 24-48 hours after surgery, and you may shower at that time.  You may have steri-strips (small skin tapes) in place directly over the incision.  These strips should be left on the skin for 7-10 days.  If your surgeon used skin glue on the  incision, you may shower in 24 hours.  The glue will flake off over the next 2-3 weeks.  Any sutures or staples will be removed at the office during your follow-up visit. 8. ACTIVITIES:  You may resume regular (light) daily activities beginning the next day--such as daily self-care, walking, climbing stairs--gradually increasing activities as tolerated.  You may have sexual intercourse when it is comfortable.  Refrain from any heavy lifting or straining until approved by your doctor. a. You may drive when you are no longer taking prescription pain medication, you can comfortably wear a seatbelt, and you can safely maneuver your car and apply brakes. b. RETURN TO WORK:  __________________________________________________________ 9. You should see your doctor in the office for a follow-up appointment approximately 2-3 weeks after your surgery.  Make sure that you call for this appointment within a day or two after you arrive home to insure a convenient appointment time. 10. OTHER INSTRUCTIONS:  __________________________________________________________________________________________________________________________________________________________________________________________  WHEN TO CALL YOUR DOCTOR: 1. Fever over 101.0 2. Inability to urinate 3. Nausea and/or vomiting 4. Extreme swelling or bruising 5. Continued bleeding from incision. 6. Increased pain, redness, or drainage from the incision  The clinic staff is available to answer your questions during regular business hours.  Please don't hesitate to call and ask to speak to one of the nurses for clinical concerns.  If you have a medical emergency, go to the nearest emergency room or call 911.  A surgeon from [facility] is always on call at the hospital     [address], [address], [place], [place]  [address] ?  [address] [address], [place], [place]   [address] [phone] ? [phone] ? FAX [phone] Web site:  [email or web address]  

## 2015-08-16 ENCOUNTER — Encounter (HOSPITAL_COMMUNITY): Payer: Self-pay | Admitting: *Deleted

## 2015-08-16 NOTE — Progress Notes (Signed)
Discharge paperwork given to patient. IV removed.Prescription and work note given to patient. Patient is ready for discharge.

## 2015-08-16 NOTE — Discharge Summary (Signed)
Physician Discharge Summary  Patient ID: Mark Heath MRN: [id number] DOB/AGE: [id number] y.o.  Admit date: 08/12/2015 Discharge date: 08/16/2015  Admission Diagnoses:  Incarcerated recurrent left inguinal hernia  Discharge Diagnoses:  Same post open repair  Active Problems:   Incarcerated inguinal hernia   Surgery:  Open left inguinal hernia repair  Discharged Condition: improved  Hospital Course:   Had surgery by Dr. Dwain Sarna.  Had a bit of an ileus which cleared.  Had scrotal swelling and some serous wound drainage.  Eating OK and ready for discharge to receive doxycycline  Consults: none  Significant Diagnostic Studies: none    Discharge Exam: Blood pressure 107/59, pulse 87, temperature 98.7 F (37.1 C), temperature source Oral, resp. rate 18, height  (1.778 m), weight 81.647 kg (180 lb), SpO2 97 %. Scrotum still swollen;  Consistent with sac fluid.    Disposition: 01-Home or Self Care  Discharge Instructions    Discharge instructions    Complete by:  As directed   Wear support briefs Use ice packs as needed Call office on Tuesday to get disability papers in order     Discharge wound care:    Complete by:  As directed   May shower Apply light dressing which may absorb wound drainage as expected     Increase activity slowly    Complete by:  As directed             Medication List    STOP taking these medications        azithromycin 250 MG tablet  Commonly known as:  ZITHROMAX     guaiFENesin-codeine 100-10 MG/5ML syrup     ipratropium 0.06 % nasal spray  Commonly known as:  ATROVENT      TAKE these medications        acetaminophen 325 MG tablet  Commonly known as:  TYLENOL  Take 2 tablets (650 mg total) by mouth every 6 (six) hours as needed for mild pain (or temp > 100).     doxycycline 100 MG tablet  Commonly known as:  VIBRA-TABS  Take 1 tablet (100 mg total) by mouth every 12 (twelve) hours. Take with a full meal to avoid nausea  from medication.     methocarbamol 500 MG tablet  Commonly known as:  ROBAXIN  Take 2 tablets (1,000 mg total) by mouth every 8 (eight) hours as needed for muscle spasms.     oxyCODONE 5 MG immediate release tablet  Commonly known as:  Oxy IR/ROXICODONE  Take 1-2 tablets (5-10 mg total) by mouth every 6 (six) hours as needed for moderate pain.           Follow-up Information    Follow up with [facility], [facility]. Go on 08/22/2015.   Specialty:  General Surgery   Why:  For post-operation check.  Your appointment is on 08/22/15 at 11:20 am, please arrive by 10:50am to check in and fill out paperwork.   Contact information:   [address] [place] [address] [phone]       Signed: [name] 08/16/2015, 8:43 AM

## 2015-09-03 ENCOUNTER — Other Ambulatory Visit: Payer: Self-pay | Admitting: General Surgery

## 2015-09-03 DIAGNOSIS — K4030 Unilateral inguinal hernia, with obstruction, without gangrene, not specified as recurrent: Secondary | ICD-10-CM

## 2015-09-05 ENCOUNTER — Other Ambulatory Visit: Payer: BLUE CROSS/BLUE SHIELD

## 2015-09-05 ENCOUNTER — Ambulatory Visit
Admission: RE | Admit: 2015-09-05 | Discharge: 2015-09-05 | Disposition: A | Discharge status: Home / Self Care | Payer: BLUE CROSS/BLUE SHIELD | Source: Ambulatory Visit | Attending: General Surgery | Admitting: General Surgery

## 2015-09-05 DIAGNOSIS — K4030 Unilateral inguinal hernia, with obstruction, without gangrene, not specified as recurrent: Secondary | ICD-10-CM

## 2015-09-05 MED ORDER — IOPAMIDOL (ISOVUE-300) INJECTION 61%
100.0000 mL | Freq: Once | INTRAVENOUS | Status: AC | PRN
  Administered 2015-09-05: 100 mL via INTRAVENOUS

## 2017-09-01 ENCOUNTER — Encounter: Payer: Self-pay | Admitting: Family Medicine

## 2017-12-22 ENCOUNTER — Encounter: Payer: Self-pay | Admitting: Family Medicine

## 2018-10-14 ENCOUNTER — Emergency Department (HOSPITAL_COMMUNITY)
Admission: EM | Admit: 2018-10-14 | Discharge: 2018-10-14 | Disposition: A | Discharge status: Home / Self Care | Payer: BLUE CROSS/BLUE SHIELD | Attending: Emergency Medicine | Admitting: Emergency Medicine

## 2018-10-14 ENCOUNTER — Emergency Department (HOSPITAL_COMMUNITY): Payer: BLUE CROSS/BLUE SHIELD

## 2018-10-14 ENCOUNTER — Encounter (HOSPITAL_COMMUNITY): Payer: Self-pay | Admitting: Emergency Medicine

## 2018-10-14 ENCOUNTER — Other Ambulatory Visit: Payer: Self-pay

## 2018-10-14 DIAGNOSIS — S0101XA Laceration without foreign body of scalp, initial encounter: Secondary | ICD-10-CM | POA: Diagnosis present

## 2018-10-14 DIAGNOSIS — S5001XA Contusion of right elbow, initial encounter: Secondary | ICD-10-CM

## 2018-10-14 DIAGNOSIS — S0990XA Unspecified injury of head, initial encounter: Secondary | ICD-10-CM

## 2018-10-14 DIAGNOSIS — W01198A Fall on same level from slipping, tripping and stumbling with subsequent striking against other object, initial encounter: Secondary | ICD-10-CM | POA: Diagnosis not present

## 2018-10-14 DIAGNOSIS — Y929 Unspecified place or not applicable: Secondary | ICD-10-CM | POA: Diagnosis not present

## 2018-10-14 DIAGNOSIS — Y93K1 Activity, walking an animal: Secondary | ICD-10-CM | POA: Insufficient documentation

## 2018-10-14 DIAGNOSIS — Y999 Unspecified external cause status: Secondary | ICD-10-CM | POA: Diagnosis not present

## 2018-10-14 LAB — CBC
HEMATOCRIT: 43.1 % (ref 39.0–52.0)
Hemoglobin: 14.7 g/dL (ref 13.0–17.0)
MCH: 32.5 pg (ref 26.0–34.0)
MCHC: 34.1 g/dL (ref 30.0–36.0)
MCV: 95.1 fL (ref 80.0–100.0)
NRBC: 0 % (ref 0.0–0.2)
Platelets: 228 10*3/uL (ref 150–400)
RBC: 4.53 MIL/uL (ref 4.22–5.81)
RDW: 11.3 % — AB (ref 11.5–15.5)
WBC: 7.2 10*3/uL (ref 4.0–10.5)

## 2018-10-14 MED ORDER — LIDOCAINE-EPINEPHRINE (PF) 2 %-1:200000 IJ SOLN
20.0000 mL | Freq: Once | INTRAMUSCULAR | Status: AC
  Administered 2018-10-14: 20 mL
  Filled 2018-10-14: qty 20

## 2018-10-14 MED ORDER — CYCLOBENZAPRINE HCL 10 MG PO TABS: 10.0000 mg | ORAL_TABLET | Freq: Three times a day (TID) | ORAL | 0 refills | Status: DC | PRN

## 2018-10-14 MED ORDER — TRAMADOL HCL 50 MG PO TABS: 50.0000 mg | ORAL_TABLET | Freq: Four times a day (QID) | ORAL | 0 refills | Status: DC | PRN

## 2018-10-14 MED ORDER — IBUPROFEN 800 MG PO TABS: 800.0000 mg | ORAL_TABLET | Freq: Four times a day (QID) | ORAL | 0 refills | Status: DC | PRN

## 2018-10-14 MED ORDER — SODIUM CHLORIDE 0.9 % IV BOLUS
1000.0000 mL | Freq: Once | INTRAVENOUS | Status: AC
  Administered 2018-10-14: 1000 mL via INTRAVENOUS

## 2018-10-14 NOTE — ED Provider Notes (Addendum)
[facility] EMERGENCY DEPARTMENT Provider Note   CSN: [id number] Arrival date & time: 10/14/18  0004     History   Chief Complaint Chief Complaint  Patient presents with  . Fall  . Head Laceration    HPI Mark Heath is a 42 y.o. male.  Patient presents to the emergency department for evaluation after a fall.  Patient reports that he was going to take the dog out for a walk, the dog pulled him and he fell down some steps and then hit his head on the metal part of his car's wheel that was parked at the bottom of the steps.  Patient reports a large amount of bleeding.  He has a headache and he also is complaining of right elbow pain.     Past Medical History:  Diagnosis Date  . Bleeding nose    as a child  . Chicken pox   . GERD (gastroesophageal reflux disease)   . Seasonal allergies   . Sinus problem     Patient Active Problem List   Diagnosis Date Noted  . Incarcerated inguinal hernia 08/13/2015    Past Surgical History:  Procedure Laterality Date  . APPENDECTOMY  2006  . HERNIA REPAIR  july 2012   umbilical & left incarcerated inguinal repair with mesh  . INGUINAL HERNIA REPAIR Left 08/12/2015   Procedure:  INCARCERATED HERNIA  INGUINAL REPAIR WITH MESH;  Surgeon: Emelia Loron, MD;  Location: [facility] OR;  Service: General;  Laterality: Left;  . TONSILLECTOMY AND ADENOIDECTOMY          Home Medications    Prior to Admission medications   Medication Sig Start Date End Date Taking? Authorizing Provider  cyclobenzaprine (FLEXERIL) 10 MG tablet Take 1 tablet (10 mg total) by mouth 3 (three) times daily as needed for muscle spasms. 10/14/18   Gilda Crease, MD  ibuprofen (ADVIL,MOTRIN) 800 MG tablet Take 1 tablet (800 mg total) by mouth every 6 (six) hours as needed for moderate pain. 10/14/18   Gilda Crease, MD  traMADol (ULTRAM) 50 MG tablet Take 1 tablet (50 mg total) by mouth every 6 (six) hours as needed. 10/14/18    Gilda Crease, MD  fluticasone (FLONASE) 50 MCG/ACT nasal spray Place 2 sprays into the nose daily. 07/09/13 11/22/13  Terressa Koyanagi, DO    Family History Family History  Problem Relation Age of Onset  . Hypertension Mother   . Heart disease Mother 13  . Arthritis Mother   . Hyperlipidemia Mother   . Stroke Mother   . Diverticulitis Mother   . Hypertension Father   . Arthritis Father   . Hyperlipidemia Father   . Diabetes Father   . Dementia Father     Social History Social History   Tobacco Use  . Smoking status: Never Smoker  . Smokeless tobacco: Never Used  Substance Use Topics  . Alcohol use: No  . Drug use: No     Allergies   Patient has no known allergies.   Review of Systems Review of Systems  Musculoskeletal: Positive for arthralgias.  Neurological: Positive for headaches.  All other systems reviewed and are negative.    Physical Exam Updated Vital Signs BP 120/87 (BP Location: Right Arm)   Pulse 75   Temp 98.5 F (36.9 C) (Oral)   Resp 16   SpO2 100%   Physical Exam  Constitutional: He is oriented to person, place, and time. He appears well-developed and well-nourished. No  distress.  HENT:  Head: Normocephalic. Head is with laceration (Right occipital, 3 cm).  Right Ear: Hearing normal.  Left Ear: Hearing normal.  Nose: Nose normal.  Mouth/Throat: Oropharynx is clear and moist and mucous membranes are normal.  Eyes: Pupils are equal, round, and reactive to light. Conjunctivae and EOM are normal.  Neck: Normal range of motion. Neck supple.  Cardiovascular: Regular rhythm, S1 normal and S2 normal. Exam reveals no gallop and no friction rub.  No murmur heard. Pulmonary/Chest: Effort normal and breath sounds normal. No respiratory distress. He exhibits no tenderness.  Abdominal: Soft. Normal appearance and bowel sounds are normal. There is no hepatosplenomegaly. There is no tenderness. There is no rebound, no guarding, no tenderness at  McBurney's point and negative Murphy's sign. No hernia.  Musculoskeletal: Normal range of motion.       Right elbow: He exhibits normal range of motion, no swelling, no effusion, no deformity and no laceration. Tenderness found.  Neurological: He is alert and oriented to person, place, and time. He has normal strength. No cranial nerve deficit or sensory deficit. Coordination normal. GCS eye subscore is 4. GCS verbal subscore is 5. GCS motor subscore is 6.  Skin: Skin is warm, dry and intact. No rash noted. No cyanosis.  Psychiatric: He has a normal mood and affect. His speech is normal and behavior is normal. Thought content normal.  Nursing note and vitals reviewed.    ED Treatments / Results  Labs (all labs ordered are listed, but only abnormal results are displayed) Labs Reviewed  CBC - Abnormal; Notable for the following components:      Result Value   RDW 11.3 (*)    All other components within normal limits    EKG None  Radiology Dg Elbow Complete Right  Result Date: 10/14/2018 CLINICAL DATA:  Larey Seat down steps.  Pain. EXAM: RIGHT ELBOW - COMPLETE 3+ VIEW COMPARISON:  None. FINDINGS: No acute fracture deformity or dislocation. Mildly angulated medial radial head. No destructive bony lesions. Soft tissue planes are not suspicious. No effusion. IMPRESSION: Radial head enthesopathy, less likely nondisplaced acute fracture. Electronically Signed   By: Awilda Metro M.D.   On: 10/14/2018 01:09   Ct Head Wo Contrast  Result Date: 10/14/2018 CLINICAL DATA:  43 year old male with C-spine trauma. EXAM: CT HEAD WITHOUT CONTRAST CT CERVICAL SPINE WITHOUT CONTRAST TECHNIQUE: Multidetector CT imaging of the head and cervical spine was performed following the standard protocol without intravenous contrast. Multiplanar CT image reconstructions of the cervical spine were also generated. COMPARISON:  None. FINDINGS: CT HEAD FINDINGS Brain: No evidence of acute infarction, hemorrhage,  hydrocephalus, extra-axial collection or mass lesion/mass effect. Vascular: No hyperdense vessel or unexpected calcification. Skull: Normal. Negative for fracture or focal lesion. Sinuses/Orbits: No acute finding. Other: None CT CERVICAL SPINE FINDINGS Alignment: Normal. Skull base and vertebrae: No acute fracture. No primary bone lesion or focal pathologic process. Soft tissues and spinal canal: No prevertebral fluid or swelling. No visible canal hematoma. Disc levels: No acute findings. No significant degenerative changes. Upper chest: Negative. Other: None IMPRESSION: 1. Normal noncontrast CT of the brain. 2. No acute/traumatic cervical spine pathology. Electronically Signed   By: Elgie Collard M.D.   On: 10/14/2018 01:29   Ct Cervical Spine Wo Contrast  Result Date: 10/14/2018 CLINICAL DATA:  42 year old male with C-spine trauma. EXAM: CT HEAD WITHOUT CONTRAST CT CERVICAL SPINE WITHOUT CONTRAST TECHNIQUE: Multidetector CT imaging of the head and cervical spine was performed following the standard protocol without  intravenous contrast. Multiplanar CT image reconstructions of the cervical spine were also generated. COMPARISON:  None. FINDINGS: CT HEAD FINDINGS Brain: No evidence of acute infarction, hemorrhage, hydrocephalus, extra-axial collection or mass lesion/mass effect. Vascular: No hyperdense vessel or unexpected calcification. Skull: Normal. Negative for fracture or focal lesion. Sinuses/Orbits: No acute finding. Other: None CT CERVICAL SPINE FINDINGS Alignment: Normal. Skull base and vertebrae: No acute fracture. No primary bone lesion or focal pathologic process. Soft tissues and spinal canal: No prevertebral fluid or swelling. No visible canal hematoma. Disc levels: No acute findings. No significant degenerative changes. Upper chest: Negative. Other: None IMPRESSION: 1. Normal noncontrast CT of the brain. 2. No acute/traumatic cervical spine pathology. Electronically Signed   By: Elgie Collard  M.D.   On: 10/14/2018 01:29    Procedures .Marland KitchenLaceration Repair Date/Time: 10/14/2018 3:35 AM Performed by: Gilda Crease, MD Authorized by: Gilda Crease, MD   Consent:    Consent obtained:  Verbal   Consent given by:  Patient   Risks discussed:  Infection and pain Universal protocol:    Procedure explained and questions answered to patient or proxy's satisfaction: yes     Relevant documents present and verified: yes     Test results available and properly labeled: yes     Imaging studies available: yes     Required blood products, implants, devices, and special equipment available: yes     Site/side marked: yes     Immediately prior to procedure, a time out was called: yes     Patient identity confirmed:  Verbally with patient Anesthesia (see MAR for exact dosages):    Anesthesia method:  Local infiltration   Local anesthetic:  Lidocaine 2% WITH epi Laceration details:    Location:  Scalp   Scalp location:  Occipital   Length (cm):  3 Repair type:    Repair type:  Simple Pre-procedure details:    Preparation:  Patient was prepped and draped in usual sterile fashion and imaging obtained to evaluate for foreign bodies Exploration:    Hemostasis achieved with:  Epinephrine   Contaminated: no   Treatment:    Area cleansed with:  Betadine   Irrigation solution:  Sterile saline   Irrigation method:  Syringe Skin repair:    Repair method:  Staples   Number of staples:  6 Approximation:    Approximation:  Close Post-procedure details:    Dressing:  Open (no dressing)   (including critical care time)  Medications Ordered in ED Medications  sodium chloride 0.9 % bolus 1,000 mL (0 mLs Intravenous Stopped 10/14/18 0134)  lidocaine-EPINEPHrine (XYLOCAINE W/EPI) 2 %-1:200000 (PF) injection 20 mL (20 mLs Infiltration Given 10/14/18 0255)     Initial Impression / Assessment and Plan / ED Course  I have reviewed the triage vital signs and the nursing  notes.  Pertinent labs & imaging results that were available during my care of the patient were reviewed by me and considered in my medical decision making (see chart for details).     Patient presents to the emergency department for evaluation of head injury after a fall.  CT head and cervical spine were unremarkable.  Patient had a 3 cm laceration on the posterior scalp that was repaired with staples.  He was mildly hypotensive and palate at arrival.  This was likely vasovagal response, as blood pressure is now normal and his hemoglobin was normal.  Patient complained of pain in the elbow.  He had a small bruise between the olecranon  process and the lateral epicondyles.  X-ray was read as Radial head enthesopathy, less likely nondisplaced acute fracture.  He does not have any tenderness over the radial head.  He has normal range of motion without pain.  Will place in sling, if no pain in 1 or 2 days, nothing further.  If he continues to have any pain, maintain sling and follow-up with orthopedics.  Final Clinical Impressions(s) / ED Diagnoses   Final diagnoses:  Laceration of scalp, initial encounter  Injury of head, initial encounter  Contusion of right elbow, initial encounter    ED Discharge Orders         Ordered    ibuprofen (ADVIL,MOTRIN) 800 MG tablet  Every 6 hours PRN     10/14/18 0324    cyclobenzaprine (FLEXERIL) 10 MG tablet  3 times daily PRN     10/14/18 0324    traMADol (ULTRAM) 50 MG tablet  Every 6 hours PRN     10/14/18 0324           Gilda Crease, MD 10/14/18 1610    Gilda Crease, MD 10/14/18 [phone]

## 2018-10-14 NOTE — ED Triage Notes (Addendum)
Pt fell down approx 6 steps 15 min ago and hit back of head on rim of car.  Approx 1 inch laceration to back of head.  Reports large amount of bleeding PTA.  Bleeding controlled on arrival.  Denies LOC.  States dog pulled him down steps.  Last DT 5 years ago.  Pt pale on arrival. Hypotensive. Denies neck and back pain.  C-collar placed at triage.  Pt to trauma room.  Manual BP 98/62.

## 2018-10-14 NOTE — ED Notes (Signed)
E-signature not available, pt verbalized understanding of DC instructions and prescriptions 

## 2018-10-22 ENCOUNTER — Encounter (HOSPITAL_COMMUNITY): Payer: Self-pay | Admitting: Pharmacy Technician

## 2018-10-22 ENCOUNTER — Other Ambulatory Visit: Payer: Self-pay

## 2018-10-22 ENCOUNTER — Emergency Department (HOSPITAL_COMMUNITY)
Admission: EM | Admit: 2018-10-22 | Discharge: 2018-10-22 | Disposition: A | Discharge status: Home / Self Care | Payer: BLUE CROSS/BLUE SHIELD | Attending: Emergency Medicine | Admitting: Emergency Medicine

## 2018-10-22 DIAGNOSIS — S0181XD Laceration without foreign body of other part of head, subsequent encounter: Secondary | ICD-10-CM | POA: Diagnosis not present

## 2018-10-22 DIAGNOSIS — Z4802 Encounter for removal of sutures: Secondary | ICD-10-CM | POA: Insufficient documentation

## 2018-10-22 DIAGNOSIS — W01198D Fall on same level from slipping, tripping and stumbling with subsequent striking against other object, subsequent encounter: Secondary | ICD-10-CM | POA: Diagnosis not present

## 2018-10-22 NOTE — ED Triage Notes (Signed)
Pt arrives POV for staple removal, states was told to come here if no PCP.

## 2018-10-22 NOTE — ED Notes (Signed)
Staple removal kit at bedside  

## 2018-10-22 NOTE — Discharge Instructions (Signed)
Your staples removed without difficulty today.  The wound is well-healing.  Please continue following up with your primary doctor and watch for signs and symptoms of infection.  If any symptoms change or worsen, please return to the nearest emergency department.

## 2018-10-22 NOTE — ED Provider Notes (Signed)
[facility] EMERGENCY DEPARTMENT Provider Note   CSN: [id number] Arrival date & time: 10/22/18  1402     History   Chief Complaint Chief Complaint  Patient presents with  . Suture / Staple Removal    HPI Mark Heath is a 42 y.o. male.  The history is provided by the patient and medical records. No language interpreter was used.  Suture / Staple Removal  This is a new problem. The current episode started more than 1 week ago. The problem occurs rarely. The problem has not changed since onset.Pertinent negatives include no chest pain, no abdominal pain, no headaches and no shortness of breath. Nothing aggravates the symptoms. He has tried nothing for the symptoms. The treatment provided no relief.    Past Medical History:  Diagnosis Date  . Bleeding nose    as a child  . Chicken pox   . GERD (gastroesophageal reflux disease)   . Seasonal allergies   . Sinus problem     Patient Active Problem List   Diagnosis Date Noted  . Incarcerated inguinal hernia 08/13/2015    Past Surgical History:  Procedure Laterality Date  . APPENDECTOMY  2006  . HERNIA REPAIR  july 2012   umbilical & left incarcerated inguinal repair with mesh  . INGUINAL HERNIA REPAIR Left 08/12/2015   Procedure:  INCARCERATED HERNIA  INGUINAL REPAIR WITH MESH;  Surgeon: Emelia Loron, MD;  Location: [facility] OR;  Service: General;  Laterality: Left;  . TONSILLECTOMY AND ADENOIDECTOMY          Home Medications    Prior to Admission medications   Medication Sig Start Date End Date Taking? Authorizing Provider  cyclobenzaprine (FLEXERIL) 10 MG tablet Take 1 tablet (10 mg total) by mouth 3 (three) times daily as needed for muscle spasms. 10/14/18   Gilda Crease, MD  ibuprofen (ADVIL,MOTRIN) 800 MG tablet Take 1 tablet (800 mg total) by mouth every 6 (six) hours as needed for moderate pain. 10/14/18   Gilda Crease, MD  traMADol (ULTRAM) 50 MG tablet Take 1 tablet (50  mg total) by mouth every 6 (six) hours as needed. 10/14/18   Gilda Crease, MD    Family History Family History  Problem Relation Age of Onset  . Hypertension Mother   . Heart disease Mother 23  . Arthritis Mother   . Hyperlipidemia Mother   . Stroke Mother   . Diverticulitis Mother   . Hypertension Father   . Arthritis Father   . Hyperlipidemia Father   . Diabetes Father   . Dementia Father     Social History Social History   Tobacco Use  . Smoking status: Never Smoker  . Smokeless tobacco: Never Used  Substance Use Topics  . Alcohol use: No  . Drug use: No     Allergies   Patient has no known allergies.   Review of Systems Review of Systems  Constitutional: Negative for chills, diaphoresis, fatigue and fever.  HENT: Negative for congestion.   Eyes: Negative for visual disturbance.  Respiratory: Negative for apnea, cough and shortness of breath.   Cardiovascular: Negative for chest pain.  Gastrointestinal: Negative for abdominal pain.  Musculoskeletal: Negative for back pain, neck pain and neck stiffness.  Skin: Positive for wound. Negative for rash.  Neurological: Negative for light-headedness and headaches.  All other systems reviewed and are negative.    Physical Exam Updated Vital Signs BP 135/87 (BP Location: Right Arm)   Pulse 84  Temp 98.3 F (36.8 C) (Oral)   Resp 16   Ht 5\' 10"  (1.778 m)   Wt 83.9 kg   SpO2 99%   BMI 26.54 kg/m   Physical Exam  Constitutional: He is oriented to person, place, and time. He appears well-developed and well-nourished. No distress.  HENT:  Head: Head is with laceration.    Nose: Nose normal.  Mouth/Throat: Oropharynx is clear and moist. No oropharyngeal exudate.  Eyes: Pupils are equal, round, and reactive to light. Conjunctivae and EOM are normal.  Neck: Normal range of motion. Neck supple.  Cardiovascular: Normal rate and intact distal pulses.  No murmur heard. Pulmonary/Chest: Effort normal  and breath sounds normal. No respiratory distress. He has no wheezes. He has no rales. He exhibits no tenderness.  Abdominal: Soft. Bowel sounds are normal. There is no tenderness.  Musculoskeletal: He exhibits no edema or tenderness.  Neurological: He is alert and oriented to person, place, and time.  Skin: Capillary refill takes less than 2 seconds. He is not diaphoretic. No erythema. No pallor.  Psychiatric: He has a normal mood and affect.  Nursing note and vitals reviewed.    ED Treatments / Results  Labs (all labs ordered are listed, but only abnormal results are displayed) Labs Reviewed - No data to display  EKG None  Radiology No results found.  Procedures .Suture Removal Date/Time: 10/22/2018 3:18 PM Performed by: Heide Scales, MD Authorized by: Heide Scales, MD   Consent:    Consent obtained:  Verbal   Consent given by:  Patient   Risks discussed:  Pain, wound separation and bleeding   Alternatives discussed:  No treatment Location:    Location:  Head/neck   Head/neck location:  Scalp Procedure details:    Wound appearance:  No signs of infection   Number of staples removed:  6 Post-procedure details:    Post-removal:  No dressing applied   Patient tolerance of procedure:  Tolerated well, no immediate complications   (including critical care time)  Medications Ordered in ED Medications - No data to display   Initial Impression / Assessment and Plan / ED Course  I have reviewed the triage vital signs and the nursing notes.  Pertinent labs & imaging results that were available during my care of the patient were reviewed by me and considered in my medical decision making (see chart for details).     Mark Heath is a 42 y.o. male with a past medical history significant for GERD, seasonal allergies, and recent visit to the ED for head injury with laceration who presents for staple removal.  Patient reports that while trick-or-treating  with his family, his dog jerked him causing him to fall down hit the back of his head.  He reported that he sustained a laceration which was repaired in the emergency department.  Patient had 6 staples placed and has been taking care of it as directed.  He reports no fevers, chills, redness, or drainage from the site.  He reports no headaches or vision changes.  No nausea or vomiting.  No neurologic complaints.  No other problems at this time.  On exam, patient has a well-healing laceration to the back of his head.  There are 6 staples that are not draining and there is no erythema.  Well-healing.  Exam otherwise unremarkable clear lungs and no murmur.  Staples removed without difficulty, 6 staples removed.  No evidence of infection and it is still well approximated.  Patient  will follow-up for outpatient management by PCP.  Patient discharged in good condition after staple removal.   Final Clinical Impressions(s) / ED Diagnoses   Final diagnoses:  Encounter for staple removal    ED Discharge Orders    None      Clinical Impression: 1. Encounter for staple removal     Disposition: Discharge  Condition: Good  I have discussed the results, Dx and Tx plan with the pt(& family if present). He/she/they expressed understanding and agree(s) with the plan. Discharge instructions discussed at great length. Strict return precautions discussed and pt &/or family have verbalized understanding of the instructions. No further questions at time of discharge.    New Prescriptions   No medications on file    Follow Up: [facility] [address] [place] [address] [phone] Schedule an appointment as soon as possible for a visit    [facility] EMERGENCY DEPARTMENT [address] [id number] mc [place] [address] [phone]       Guerin Lashomb, Canary Brim, MD 10/22/18 [phone]

## 2019-11-30 ENCOUNTER — Ambulatory Visit: Payer: BLUE CROSS/BLUE SHIELD | Admitting: Family Medicine

## 2019-12-31 ENCOUNTER — Other Ambulatory Visit: Payer: Self-pay

## 2020-01-01 ENCOUNTER — Ambulatory Visit (INDEPENDENT_AMBULATORY_CARE_PROVIDER_SITE_OTHER): Payer: BC Managed Care – PPO | Admitting: Family Medicine

## 2020-01-01 ENCOUNTER — Other Ambulatory Visit: Payer: Self-pay

## 2020-01-01 ENCOUNTER — Encounter: Payer: Self-pay | Admitting: Family Medicine

## 2020-01-01 VITALS — BP 132/84 | HR 81 | Temp 97.9°F | Ht 70.0 in | Wt 192.2 lb

## 2020-01-01 DIAGNOSIS — Z13220 Encounter for screening for lipoid disorders: Secondary | ICD-10-CM

## 2020-01-01 DIAGNOSIS — E663 Overweight: Secondary | ICD-10-CM

## 2020-01-01 DIAGNOSIS — Z6827 Body mass index (BMI) 27.0-27.9, adult: Secondary | ICD-10-CM

## 2020-01-01 DIAGNOSIS — K439 Ventral hernia without obstruction or gangrene: Secondary | ICD-10-CM

## 2020-01-01 DIAGNOSIS — Z0001 Encounter for general adult medical examination with abnormal findings: Secondary | ICD-10-CM

## 2020-01-01 DIAGNOSIS — I8390 Asymptomatic varicose veins of unspecified lower extremity: Secondary | ICD-10-CM | POA: Diagnosis not present

## 2020-01-01 NOTE — Assessment & Plan Note (Signed)
Easily reducible.  No red flags.  Does not want surgical referral at this point.  Discussed importance of core strengthening exercises.

## 2020-01-01 NOTE — Progress Notes (Signed)
Chief Complaint:  Mark Heath is a 44 y.o. male who presents today for his annual comprehensive physical exam.  He is a new patient.   Assessment/Plan:  Chronic Problems Addressed Today: Ventral hernia Easily reducible.  No red flags.  Does not want surgical referral at this point.  Discussed importance of core strengthening exercises.  Varicose vein of leg No red flags.  Recommended compression stockings.  Offered referral to vascular surgery however patient deferred for today.   Body mass index is 27.59 kg/m. / Overweight BMI Metric Follow Up - 01/01/20 1458      BMI Metric Follow Up-Please document annually   BMI Metric Follow Up  Education provided        Preventative Healthcare: Check CBC, CMP, TSH, lipid panel.  Patient Counseling(The following topics were reviewed and/or handout was given):  -Nutrition: Stressed importance of moderation in sodium/caffeine intake, saturated fat and cholesterol, caloric balance, sufficient intake of fresh fruits, vegetables, and fiber.  -Stressed the importance of regular exercise.   -Substance Abuse: Discussed cessation/primary prevention of tobacco, alcohol, or other drug use; driving or other dangerous activities under the influence; availability of treatment for abuse.   -Injury prevention: Discussed safety belts, safety helmets, smoke detector, smoking near bedding or upholstery.   -Sexuality: Discussed sexually transmitted diseases, partner selection, use of condoms, avoidance of unintended pregnancy and contraceptive alternatives.   -Dental health: Discussed importance of regular tooth brushing, flossing, and dental visits.  -Health maintenance and immunizations reviewed. Please refer to Health maintenance section.  Return to care in 1 year for next preventative visit.     Subjective:  HPI:  He has no acute complaints today.   He has had several year history of varicose veins in bilateral legs.  Has not tried anything for  it specifically.  Occasional very mild pain.  He has also had history of hernia in the past.  Has noticed his mid abdominal hernia has been increasing size over the last several months.  No reported nausea or vomiting.  Lifestyle Diet: No specific diets or eating plans.  Exercise: Busy at work.   No flowsheet data found.  Health Maintenance Due  Topic Date Due  . HIV Screening  06/22/1991     ROS: Per HPI, otherwise a complete review of systems was negative.   PMH:  The following were reviewed and entered/updated in epic: Past Medical History:  Diagnosis Date  . Bleeding nose    as a child  . Chicken pox   . GERD (gastroesophageal reflux disease)   . Seasonal allergies   . Sinus problem    Patient Active Problem List   Diagnosis Date Noted  . Varicose vein of leg 01/01/2020  . Ventral hernia 01/01/2020  . Incarcerated inguinal hernia 08/13/2015   Past Surgical History:  Procedure Laterality Date  . APPENDECTOMY  2006  . HERNIA REPAIR  july 2012   umbilical & left incarcerated inguinal repair with mesh  . INGUINAL HERNIA REPAIR Left 08/12/2015   Procedure:  INCARCERATED HERNIA  INGUINAL REPAIR WITH MESH;  Surgeon: Emelia Loron, MD;  Location: [facility] OR;  Service: General;  Laterality: Left;  . TONSILLECTOMY AND ADENOIDECTOMY      Family History  Problem Relation Age of Onset  . Hypertension Mother   . Heart disease Mother 59  . Arthritis Mother   . Hyperlipidemia Mother   . Stroke Mother   . Diverticulitis Mother   . Breast cancer Mother   . Hypertension Father   .  Arthritis Father   . Hyperlipidemia Father   . Diabetes Father   . Dementia Father     Medications- reviewed and updated Current Outpatient Medications  Medication Sig Dispense Refill  . ELDERBERRY PO Take by mouth.    . Multiple Vitamin (MULTIVITAMIN) capsule Take 1 capsule by mouth daily.    Marland Kitchen Specialty Vitamins Products (MAGNESIUM, AMINO ACID CHELATE,) 133 MG tablet Take 1 tablet by mouth  2 (two) times daily.     No current facility-administered medications for this visit.    Allergies-reviewed and updated No Known Allergies  Social History   Socioeconomic History  . Marital status: Married    Spouse name: Not on file  . Number of children: Not on file  . Years of education: Not on file  . Highest education level: Not on file  Occupational History  . Not on file  Tobacco Use  . Smoking status: Never Smoker  . Smokeless tobacco: Never Used  Substance and Sexual Activity  . Alcohol use: Yes    Comment: Occ  . Drug use: No  . Sexual activity: Not on file  Other Topics Concern  . 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 Difficulty of Paying Living Expenses: Not on file  Food Insecurity:   . Worried About Programme researcher, broadcasting/film/video in the Last Year: Not on file  . Ran Out of Food in the Last Year: Not on file  Transportation Needs:   . Lack of Transportation (Medical): Not on file  . Lack of Transportation (Non-Medical): Not on file  Physical Activity:   . Days of Exercise per Week: Not on file  . Minutes of Exercise per Session: Not on file  Stress:   . Feeling of Stress : Not on file  Social Connections:   . Frequency of Communication with Friends and Family: Not on file  . Frequency of Social Gatherings with Friends and Family: Not on file  . Attends Religious Services: Not on file  . Active Member of Clubs or Organizations: Not on file  . Attends Banker Meetings: Not on file  . Marital Status: Not on file        Objective:  Physical Exam: BP 132/84   Pulse 81   Temp 97.9 F (36.6 C)   Ht 5\' 10"  (1.778 m)   Wt 192 lb 4 oz (87.2 kg)   SpO2 98%   BMI 27.59 kg/m   Body mass  index is 27.59 kg/m. Wt Readings from Last 3 Encounters:  01/01/20 192 lb 4 oz (87.2 kg)  10/22/18 185 lb (83.9 kg)  08/12/15 180 lb (81.6 kg)   Gen: NAD, resting comfortably HEENT: TMs normal bilaterally. OP clear. No thyromegaly noted.  CV: RRR with no murmurs appreciated Pulm: NWOB, CTAB with no crackles, wheezes, or rhonchi GI: Normal bowel sounds present. Soft, Nontender, Nondistended.  Easily reducible ventral hernia noted. MSK: no edema, cyanosis, or clubbing noted.  Bilateral varicosities noted. Skin: warm, dry Neuro: CN2-12 grossly intact. Strength 5/5 in upper and lower extremities. Reflexes symmetric and intact bilaterally.  Psych: Normal affect and thought content  Algis Greenhouse. Jerline Pain, MD 01/01/2020 3:00 PM

## 2020-01-01 NOTE — Patient Instructions (Signed)
It was very nice to see you today!  Please use compression stockings for her legs.  Please work on core strengthening exercises for your hernia.  Let me know if you like a referral to see a surgeon for either these issues.  We will check blood work today.  Come back in 1 year for your next checkup, or sooner if needed.  Take care, Dr Jerline Pain  Please try these tips to maintain a healthy lifestyle:   Eat at least 3 REAL meals and 1-2 snacks per day.  Aim for no more than 5 hours between eating.  If you eat breakfast, please do so within one hour of getting up.    Each meal should contain half fruits/vegetables, one quarter protein, and one quarter carbs (no bigger than a computer mouse)   Cut down on sweet beverages. This includes juice, soda, and sweet tea.     Drink at least 1 glass of water with each meal and aim for at least 8 glasses per day   Exercise at least 150 minutes every week.    Preventive Care 71-31 Years Old, Male Preventive care refers to lifestyle choices and visits with your health care provider that can promote health and wellness. This includes:  A yearly physical exam. This is also called an annual well check.  Regular dental and eye exams.  Immunizations.  Screening for certain conditions.  Healthy lifestyle choices, such as eating a healthy diet, getting regular exercise, not using drugs or products that contain nicotine and tobacco, and limiting alcohol use. What can I expect for my preventive care visit? Physical exam Your health care provider will check:  Height and weight. These may be used to calculate body mass index (BMI), which is a measurement that tells if you are at a healthy weight.  Heart rate and blood pressure.  Your skin for abnormal spots. Counseling Your health care provider may ask you questions about:  Alcohol, tobacco, and drug use.  Emotional well-being.  Home and relationship well-being.  Sexual  activity.  Eating habits.  Work and work Statistician. What immunizations do I need?  Influenza (flu) vaccine  This is recommended every year. Tetanus, diphtheria, and pertussis (Tdap) vaccine  You may need a Td booster every 10 years. Varicella (chickenpox) vaccine  You may need this vaccine if you have not already been vaccinated. Zoster (shingles) vaccine  You may need this after age 83. Measles, mumps, and rubella (MMR) vaccine  You may need at least one dose of MMR if you were born in 1957 or later. You may also need a second dose. Pneumococcal conjugate (PCV13) vaccine  You may need this if you have certain conditions and were not previously vaccinated. Pneumococcal polysaccharide (PPSV23) vaccine  You may need one or two doses if you smoke cigarettes or if you have certain conditions. Meningococcal conjugate (MenACWY) vaccine  You may need this if you have certain conditions. Hepatitis A vaccine  You may need this if you have certain conditions or if you travel or work in places where you may be exposed to hepatitis A. Hepatitis B vaccine  You may need this if you have certain conditions or if you travel or work in places where you may be exposed to hepatitis B. Haemophilus influenzae type b (Hib) vaccine  You may need this if you have certain risk factors. Human papillomavirus (HPV) vaccine  If recommended by your health care provider, you may need three doses over 6 months. You may  receive vaccines as individual doses or as more than one vaccine together in one shot (combination vaccines). Talk with your health care provider about the risks and benefits of combination vaccines. What tests do I need? Blood tests  Lipid and cholesterol levels. These may be checked every 5 years, or more frequently if you are over 43 years old.  Hepatitis C test.  Hepatitis B test. Screening  Lung cancer screening. You may have this screening every year starting at age 46 if  you have a 30-pack-year history of smoking and currently smoke or have quit within the past 15 years.  Prostate cancer screening. Recommendations will vary depending on your family history and other risks.  Colorectal cancer screening. All adults should have this screening starting at age 47 and continuing until age 83. Your health care provider may recommend screening at age 64 if you are at increased risk. You will have tests every 1-10 years, depending on your results and the type of screening test.  Diabetes screening. This is done by checking your blood sugar (glucose) after you have not eaten for a while (fasting). You may have this done every 1-3 years.  Sexually transmitted disease (STD) testing. Follow these instructions at home: Eating and drinking  Eat a diet that includes fresh fruits and vegetables, whole grains, lean protein, and low-fat dairy products.  Take vitamin and mineral supplements as recommended by your health care provider.  Do not drink alcohol if your health care provider tells you not to drink.  If you drink alcohol: ? Limit how much you have to 0-2 drinks a day. ? Be aware of how much alcohol is in your drink. In the [place] one drink equals one 12 oz bottle of beer (355 mL), one 5 oz glass of wine (148 mL), or one 1 oz glass of hard liquor (44 mL). Lifestyle  Take daily care of your teeth and gums.  Stay active. Exercise for at least 30 minutes on 5 or more days each week.  Do not use any products that contain nicotine or tobacco, such as cigarettes, e-cigarettes, and chewing tobacco. If you need help quitting, ask your health care provider.  If you are sexually active, practice safe sex. Use a condom or other form of protection to prevent STIs (sexually transmitted infections).  Talk with your health care provider about taking a low-dose aspirin every day starting at age 48. What's next?  Go to your health care provider once a year for a well check  visit.  Ask your health care provider how often you should have your eyes and teeth checked.  Stay up to date on all vaccines. This information is not intended to replace advice given to you by your health care provider. Make sure you discuss any questions you have with your health care provider. Document Revised: 11/23/2018 Document Reviewed: 11/23/2018 Elsevier Patient Education  2020 [employer].

## 2020-01-01 NOTE — Assessment & Plan Note (Signed)
No red flags.  Recommended compression stockings.  Offered referral to vascular surgery however patient deferred for today.

## 2020-01-02 LAB — COMPREHENSIVE METABOLIC PANEL
ALT: 22 U/L (ref 0–53)
AST: 19 U/L (ref 0–37)
Albumin: 4.2 g/dL (ref 3.5–5.2)
Alkaline Phosphatase: 51 U/L (ref 39–117)
BUN: 15 mg/dL (ref 6–23)
CO2: 28 mEq/L (ref 19–32)
Calcium: 9.9 mg/dL (ref 8.4–10.5)
Chloride: 104 mEq/L (ref 96–112)
Creatinine, Ser: 0.89 mg/dL (ref 0.40–1.50)
GFR: 93.06 mL/min (ref >60.00)
Glucose, Bld: 89 mg/dL (ref 70–99)
Potassium: 4.5 mEq/L (ref 3.5–5.1)
Sodium: 138 mEq/L (ref 135–145)
Total Bilirubin: 0.7 mg/dL (ref 0.2–1.2)
Total Protein: 6.9 g/dL (ref 6.0–8.3)

## 2020-01-02 LAB — CBC
HCT: 44.6 % (ref 39.0–52.0)
Hemoglobin: 15.1 g/dL (ref 13.0–17.0)
MCHC: 33.8 g/dL (ref 30.0–36.0)
MCV: 96.7 fl (ref 78.0–100.0)
Platelets: 257 10*3/uL (ref 150.0–400.0)
RBC: 4.61 Mil/uL (ref 4.22–5.81)
RDW: 12.4 % (ref 11.5–15.5)
WBC: 6.2 10*3/uL (ref 4.0–10.5)

## 2020-01-02 LAB — LIPID PANEL
Cholesterol: 213 mg/dL — ABNORMAL HIGH (ref 0–200)
HDL: 42 mg/dL (ref >39.00)
NonHDL: 170.53
Total CHOL/HDL Ratio: 5
Triglycerides: 273 mg/dL — ABNORMAL HIGH (ref 0.0–149.0)
VLDL: 54.6 mg/dL — ABNORMAL HIGH (ref 0.0–40.0)

## 2020-01-02 LAB — TSH: TSH: 1.05 u[IU]/mL (ref 0.35–4.50)

## 2020-01-02 LAB — LDL CHOLESTEROL, DIRECT: Direct LDL: 133 mg/dL

## 2020-01-03 ENCOUNTER — Encounter: Payer: Self-pay | Admitting: Family Medicine

## 2020-01-03 DIAGNOSIS — E785 Hyperlipidemia, unspecified: Secondary | ICD-10-CM | POA: Insufficient documentation

## 2020-01-03 NOTE — Progress Notes (Signed)
Please inform patient of the following:  Cholesterol is mildly elevated but everything else is NORMAL. Do not need to start meds. Recommend he continue to work on diet and exercise and we can recheck in a year or so.  Katina Degree. Jimmey Ralph, MD 01/03/2020 2:28 PM

## 2020-01-11 ENCOUNTER — Other Ambulatory Visit: Payer: Self-pay | Admitting: Family Medicine

## 2020-01-11 DIAGNOSIS — R5383 Other fatigue: Secondary | ICD-10-CM

## 2020-01-14 ENCOUNTER — Other Ambulatory Visit (INDEPENDENT_AMBULATORY_CARE_PROVIDER_SITE_OTHER): Payer: BC Managed Care – PPO

## 2020-01-14 ENCOUNTER — Other Ambulatory Visit: Payer: Self-pay

## 2020-01-14 DIAGNOSIS — R5383 Other fatigue: Secondary | ICD-10-CM | POA: Diagnosis not present

## 2020-01-14 LAB — TESTOSTERONE: Testosterone: 373.43 ng/dL (ref 300.00–890.00)

## 2020-01-14 NOTE — Progress Notes (Signed)
Please inform patient of the following:  Testosterone level is NORMAL.

## 2020-01-18 ENCOUNTER — Telehealth: Payer: Self-pay | Admitting: Family Medicine

## 2020-01-18 NOTE — Telephone Encounter (Signed)
See lab note.  

## 2020-01-18 NOTE — Telephone Encounter (Signed)
Mr.Mckown is calling for his lab results.

## 2021-06-20 ENCOUNTER — Emergency Department (HOSPITAL_COMMUNITY)
Admission: EM | Admit: 2021-06-20 | Discharge: 2021-06-20 | Discharge status: Against Medical Advice | Payer: BC Managed Care – PPO

## 2021-06-20 ENCOUNTER — Encounter (HOSPITAL_COMMUNITY): Payer: Self-pay | Admitting: *Deleted

## 2021-06-20 ENCOUNTER — Other Ambulatory Visit: Payer: Self-pay

## 2021-06-20 ENCOUNTER — Emergency Department (HOSPITAL_COMMUNITY)
Admission: EM | Admit: 2021-06-20 | Discharge: 2021-06-20 | Disposition: A | Discharge status: Home / Self Care | Payer: No Typology Code available for payment source | Attending: Emergency Medicine | Admitting: Emergency Medicine

## 2021-06-20 ENCOUNTER — Emergency Department (HOSPITAL_COMMUNITY): Payer: No Typology Code available for payment source

## 2021-06-20 DIAGNOSIS — W208XXA Other cause of strike by thrown, projected or falling object, initial encounter: Secondary | ICD-10-CM | POA: Diagnosis not present

## 2021-06-20 DIAGNOSIS — S91011A Laceration without foreign body, right ankle, initial encounter: Secondary | ICD-10-CM

## 2021-06-20 DIAGNOSIS — S99911A Unspecified injury of right ankle, initial encounter: Secondary | ICD-10-CM | POA: Diagnosis present

## 2021-06-20 DIAGNOSIS — Y990 Civilian activity done for income or pay: Secondary | ICD-10-CM | POA: Insufficient documentation

## 2021-06-20 DIAGNOSIS — Z23 Encounter for immunization: Secondary | ICD-10-CM | POA: Diagnosis not present

## 2021-06-20 MED ORDER — LIDOCAINE-EPINEPHRINE (PF) 2 %-1:200000 IJ SOLN
10.0000 mL | Freq: Once | INTRAMUSCULAR | Status: AC
  Administered 2021-06-20: 10 mL
  Filled 2021-06-20: qty 20

## 2021-06-20 MED ORDER — NAPROXEN 250 MG PO TABS
500.0000 mg | ORAL_TABLET | Freq: Once | ORAL | Status: AC
  Administered 2021-06-20: 500 mg via ORAL
  Filled 2021-06-20: qty 2

## 2021-06-20 MED ORDER — TETANUS-DIPHTH-ACELL PERTUSSIS 5-2.5-18.5 LF-MCG/0.5 IM SUSY
0.5000 mL | PREFILLED_SYRINGE | Freq: Once | INTRAMUSCULAR | Status: AC
  Administered 2021-06-20: 0.5 mL via INTRAMUSCULAR
  Filled 2021-06-20: qty 0.5

## 2021-06-20 NOTE — ED Provider Notes (Signed)
Patient is a 45 year old male whose care was transferred to me at shift change from [facility].  Her HPI is below:  45 year old male presents to the emergency department for evaluation of right ankle pain and laceration after a piece of metal fell from a package onto his right ankle.  He has had pain which is aggravated with weightbearing, associated ankle swelling.  Has cleaned the wound, but not performed any additional interventions.  No medications taken prior to arrival.  Cannot recall the date of his last tetanus shot.   The history is provided by the patient. No language interpreter was used.  Physical Exam  BP (!) 145/99 (BP Location: Right Arm)   Pulse 77   Temp 98.1 F (36.7 C) (Oral)   Resp 16   Ht 5\' 10"  (1.778 m)   Wt 88.5 kg   SpO2 98%   BMI 27.98 kg/m   Physical Exam Vitals and nursing note reviewed. Constitutional:      General: He is not in acute distress.    Appearance: He is well-developed. He is not diaphoretic.    Comments: Nontoxic-appearing and in no distress  HENT:    Head: Normocephalic and atraumatic. Eyes:    General: No scleral icterus.    Conjunctiva/sclera: Conjunctivae normal. Cardiovascular:    Rate and Rhythm: Normal rate and regular rhythm.    Pulses: Normal pulses.    Comments: DP pulse 2+ in the right lower extremity Pulmonary:    Effort: Pulmonary effort is normal. No respiratory distress.    Comments: Respirations even and unlabored Musculoskeletal:        General: Normal range of motion.    Cervical back: Normal range of motion.    Comments: Swelling of the right ankle with associated tenderness to palpation of the lateral malleolus.  There is a 4 cm laceration just above the lateral malleolus with minimal bleeding.  No crepitus or deformity.  Skin:    General: Skin is warm and dry.    Coloration: Skin is not pale.    Findings: No erythema or rash. Neurological:    Mental Status: He is alert and oriented to person, place, and  time. Psychiatric:        Behavior: Behavior normal. ED Course/Procedures     Procedures  MDM  Patient is a 45 year old male whose care was transferred to me at shift change from [facility].  He presented for ankle pain and swelling as well as a laceration to the joint.  At shift change patient was pending x-rays of the right ankle.    Wound was cleaned and closed with Prolene sutures by previous provider.  Tdap updated.  Please see her note below for additional information.  X-rays have resulted and are negative for any osseous abnormalities.  Patient offered a cam boot but he states he is ambulatory at this time and does not feel that it is necessary.  Feel the patient is stable for discharge at this time and he is agreeable.  We discussed wound care at length.  Suture removal in 7 days.  His questions were answered and he was amicable at the time of discharge.       [facility], PA-C 06/20/21 08/21/21    1540, MD 06/21/21 [phone]

## 2021-06-20 NOTE — ED Provider Notes (Signed)
[name] EMERGENCY DEPARTMENT Provider Note   CSN: [id number] Arrival date & time: 06/20/21  2297     History Chief Complaint  Patient presents with  . Extremity Laceration    Mark Heath is a 45 y.o. male.  45 year old male presents to the emergency department for evaluation of right ankle pain and laceration after a piece of metal fell from a package onto his right ankle.  He has had pain which is aggravated with weightbearing, associated ankle swelling.  Has cleaned the wound, but not performed any additional interventions.  No medications taken prior to arrival.  Cannot recall the date of his last tetanus shot.  The history is provided by the patient. No language interpreter was used.      Past Medical History:  Diagnosis Date  . Bleeding nose    as a child  . Chicken pox   . GERD (gastroesophageal reflux disease)   . Seasonal allergies   . Sinus problem     Patient Active Problem List   Diagnosis Date Noted  . Dyslipidemia 01/03/2020  . Varicose vein of leg 01/01/2020  . Ventral hernia 01/01/2020  . Incarcerated inguinal hernia 08/13/2015    Past Surgical History:  Procedure Laterality Date  . APPENDECTOMY  2006  . HERNIA REPAIR  july 2012   umbilical & left incarcerated inguinal repair with mesh  . INGUINAL HERNIA REPAIR Left 08/12/2015   Procedure:  INCARCERATED HERNIA  INGUINAL REPAIR WITH MESH;  Surgeon: Emelia Loron, MD;  Location: [facility] OR;  Service: General;  Laterality: Left;  . TONSILLECTOMY AND ADENOIDECTOMY         Family History  Problem Relation Age of Onset  . Hypertension Mother   . Heart disease Mother 31  . Arthritis Mother   . Hyperlipidemia Mother   . Stroke Mother   . Diverticulitis Mother   . Breast cancer Mother   . Hypertension Father   . Arthritis Father   . Hyperlipidemia Father   . Diabetes Father   . Dementia Father     Social History   Tobacco Use  . Smoking status: Never  . Smokeless  tobacco: Never  Vaping Use  . Vaping Use: Never used  Substance Use Topics  . Alcohol use: Yes    Comment: Occ  . Drug use: No    Home Medications Prior to Admission medications   Medication Sig Start Date End Date Taking? Authorizing Provider  ELDERBERRY PO Take by mouth.    [provider]  Multiple Vitamin (MULTIVITAMIN) capsule Take 1 capsule by mouth daily.    [provider]  Specialty Vitamins Products (MAGNESIUM, AMINO ACID CHELATE,) 133 MG tablet Take 1 tablet by mouth 2 (two) times daily.    [provider]    Allergies    Patient has no known allergies.  Review of Systems   Review of Systems Ten systems reviewed and are negative for acute change, except as noted in the HPI.    Physical Exam Updated Vital Signs BP (!) 145/99 (BP Location: Right Arm)   Pulse 77   Temp 98.1 F (36.7 C) (Oral)   Resp 16   Ht 5\' 10"  (1.778 m)   Wt 88.5 kg   SpO2 98%   BMI 27.98 kg/m   Physical Exam Vitals and nursing note reviewed.  Constitutional:      General: He is not in acute distress.    Appearance: He is well-developed. He is not diaphoretic.  Comments: Nontoxic-appearing and in no distress  HENT:     Head: Normocephalic and atraumatic.  Eyes:     General: No scleral icterus.    Conjunctiva/sclera: Conjunctivae normal.  Cardiovascular:     Rate and Rhythm: Normal rate and regular rhythm.     Pulses: Normal pulses.     Comments: DP pulse 2+ in the right lower extremity Pulmonary:     Effort: Pulmonary effort is normal. No respiratory distress.     Comments: Respirations even and unlabored Musculoskeletal:        General: Normal range of motion.     Cervical back: Normal range of motion.     Comments: Swelling of the right ankle with associated tenderness to palpation of the lateral malleolus.  There is a 4 cm laceration just above the lateral malleolus with minimal bleeding.  No crepitus or deformity.  Skin:    General: Skin is  warm and dry.     Coloration: Skin is not pale.     Findings: No erythema or rash.  Neurological:     Mental Status: He is alert and oriented to person, place, and time.  Psychiatric:        Behavior: Behavior normal.    ED Results / Procedures / Treatments   Labs (all labs ordered are listed, but only abnormal results are displayed) Labs Reviewed - No data to display  EKG None  Radiology No results found.  Procedures .Marland KitchenLaceration Repair  Date/Time: 06/20/2021 6:26 AM Performed by: Antony Madura, PA-C Authorized by: Antony Madura, PA-C   Consent:    Consent obtained:  Verbal and emergent situation   Consent given by:  Patient   Risks, benefits, and alternatives were discussed: yes     Risks discussed:  Pain, infection and poor cosmetic result   Alternatives discussed:  No treatment Universal protocol:    Relevant documents present and verified: yes     Test results available: yes     Imaging studies available: yes     Required blood products, implants, devices, and special equipment available: yes     Site/side marked: yes     Patient identity confirmed:  Verbally with patient and arm band Anesthesia:    Anesthesia method:  Local infiltration   Local anesthetic:  Lidocaine 2% WITH epi Laceration details:    Location:  Foot   Foot location:  R ankle   Length (cm):  4 Treatment:    Area cleansed with:  Povidone-iodine   Amount of cleaning:  Standard   Irrigation solution:  Tap water   Irrigation method:  Syringe   Debridement:  None Skin repair:    Repair method:  Sutures   Suture size:  4-0   Suture material:  Prolene   Suture technique:  Simple interrupted   Number of sutures:  4 Approximation:    Approximation:  Close Repair type:    Repair type:  Simple Post-procedure details:    Dressing:  Non-adherent dressing   Procedure completion:  Tolerated well, no immediate complications   Medications Ordered in ED Medications  lidocaine-EPINEPHrine  (XYLOCAINE W/EPI) 2 %-1:200000 (PF) injection 10 mL (has no administration in time range)  Tdap (BOOSTRIX) injection 0.5 mL (has no administration in time range)  naproxen (NAPROSYN) tablet 500 mg (has no administration in time range)    ED Course  I have reviewed the triage vital signs and the nursing notes.  Pertinent labs & imaging results that were available during my care of the  patient were reviewed by me and considered in my medical decision making (see chart for details).    MDM Rules/Calculators/A&P                          45 year old male presenting with laceration to his right ankle.  There is associated tenderness to the lateral malleolus and swelling of the joint.  Question fracture of the right distal fibula.  Will obtain x-ray for assessment.  His tetanus has been updated and laceration closed with Prolene sutures without complications.  Pending imaging results.  Care signed out to [place], PA-C at shift change.   Final Clinical Impression(s) / ED Diagnoses Final diagnoses:  Laceration of right ankle, initial encounter    Rx / DC Orders ED Discharge Orders     None        Antony Madura, PA-C 06/20/21 [id number]    Sabas Sous, MD 06/20/21 [id number]

## 2021-06-20 NOTE — ED Notes (Signed)
Patient transported to X-ray 

## 2021-06-20 NOTE — Discharge Instructions (Addendum)
Avoid soaking your wound in stagnant or dirty water such as while taking a bath. You can shower normally. Keep the area clean with mild soap and warm water. Do not apply peroxide or alcohol to your wound as this can break down newly forming skin and prolong wound healing. If you keep the area bandaged, change the dressing/bandage at least once per day. Have your staples/sutures removed in 7 days. °

## 2021-06-20 NOTE — ED Triage Notes (Signed)
States he was at work yes am and a package with a piece of metal fell onto his right ankle. Laceration to outer aspect of right ankle.

## 2021-12-09 DIAGNOSIS — M79604 Pain in right leg: Secondary | ICD-10-CM | POA: Insufficient documentation

## 2021-12-10 ENCOUNTER — Encounter (HOSPITAL_COMMUNITY): Payer: Self-pay

## 2021-12-10 ENCOUNTER — Emergency Department (HOSPITAL_COMMUNITY): Payer: BC Managed Care – PPO

## 2021-12-10 ENCOUNTER — Other Ambulatory Visit: Payer: Self-pay

## 2021-12-10 ENCOUNTER — Emergency Department (HOSPITAL_BASED_OUTPATIENT_CLINIC_OR_DEPARTMENT_OTHER)
Admission: RE | Admit: 2021-12-10 | Discharge: 2021-12-10 | Disposition: A | Discharge status: Home / Self Care | Payer: BC Managed Care – PPO | Source: Ambulatory Visit | Attending: Emergency Medicine | Admitting: Emergency Medicine

## 2021-12-10 ENCOUNTER — Emergency Department (HOSPITAL_COMMUNITY)
Admission: EM | Admit: 2021-12-10 | Discharge: 2021-12-10 | Disposition: A | Discharge status: Home / Self Care | Payer: BC Managed Care – PPO | Attending: Emergency Medicine | Admitting: Emergency Medicine

## 2021-12-10 DIAGNOSIS — M79604 Pain in right leg: Secondary | ICD-10-CM | POA: Diagnosis not present

## 2021-12-10 LAB — I-STAT CHEM 8, ED
BUN: 18 mg/dL (ref 6–20)
Calcium, Ion: 1.2 mmol/L (ref 1.15–1.40)
Chloride: 107 mmol/L (ref 98–111)
Creatinine, Ser: 1 mg/dL (ref 0.61–1.24)
Glucose, Bld: 135 mg/dL — ABNORMAL HIGH (ref 70–99)
HCT: 39 % (ref 39.0–52.0)
Hemoglobin: 13.3 g/dL (ref 13.0–17.0)
Potassium: 3.8 mmol/L (ref 3.5–5.1)
Sodium: 142 mmol/L (ref 135–145)
TCO2: 26 mmol/L (ref 22–32)

## 2021-12-10 NOTE — ED Provider Notes (Signed)
[facility]-EMERGENCY DEPT Provider Note   CSN: [id number] Arrival date & time: 12/09/21  2359     History Chief Complaint  Patient presents with   Leg Pain    Mark Heath is a 45 y.o. male.  The history is provided by the patient and medical records.  Leg Pain  45 year old male here with right leg pain.  States he started yesterday while he was at work, almost felt like a bad cramp in the soft tissue on medial side of right knee.  States got better, went to work today and was rubbing his legs and noticed to "knots" on his distal thigh.  States he thought maybe it was just muscle tension from having cramps yesterday, but became concerned for blood clot.  He denies any numbness or weakness of the leg.  He remains ambulatory.  He has no history of DVT or PE.  No complaint of chest pain or shortness of breath.  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   Ventral hernia 01/01/2020   Incarcerated inguinal hernia 08/13/2015    Past Surgical History:  Procedure Laterality Date   APPENDECTOMY  2006   HERNIA REPAIR  july 2012   umbilical & left incarcerated inguinal repair with mesh   INGUINAL HERNIA REPAIR Left 08/12/2015   Procedure:  INCARCERATED HERNIA  INGUINAL REPAIR WITH MESH;  Surgeon: Emelia Loron, MD;  Location: [facility] OR;  Service: General;  Laterality: Left;   TONSILLECTOMY AND ADENOIDECTOMY         Family History  Problem Relation Age of Onset   Hypertension Mother    Heart disease Mother 67   Arthritis Mother    Hyperlipidemia Mother    Stroke Mother    Diverticulitis Mother    Breast cancer Mother    Hypertension Father    Arthritis Father    Hyperlipidemia Father    Diabetes Father     Dementia Father     Social History   Tobacco Use   Smoking status: Never   Smokeless tobacco: Never  Vaping Use   Vaping Use: Never used  Substance Use Topics   Alcohol use: Yes    Comment: Occ   Drug use: No    Home Medications Prior to Admission medications   Medication Sig Start Date End Date Taking? Authorizing Provider  ELDERBERRY PO Take by mouth.    [provider]  Multiple Vitamin (MULTIVITAMIN) capsule Take 1 capsule by mouth daily.    [provider]  Specialty Vitamins Products (MAGNESIUM, AMINO ACID CHELATE,) 133 MG tablet Take 1 tablet by mouth 2 (two) times daily.    [provider]    Allergies    Patient has no known allergies.  Review of Systems   Review of Systems  Musculoskeletal:  Positive for arthralgias.  All other systems reviewed and are negative.  Physical Exam Updated Vital Signs BP (!) 152/99 (BP Location: Left Arm)    Pulse 97    Temp 98.7 F (37.1 C) (Oral)    Resp 15    Ht 5\' 10"  (1.778 m)    Wt 90.7 kg    SpO2 98%    BMI  28.70 kg/m   Physical Exam Vitals and nursing note reviewed.  Constitutional:      Appearance: He is well-developed.  HENT:     Head: Normocephalic and atraumatic.  Eyes:     Conjunctiva/sclera: Conjunctivae normal.     Pupils: Pupils are equal, round, and reactive to light.  Cardiovascular:     Rate and Rhythm: Normal rate and regular rhythm.     Heart sounds: Normal heart sounds.  Pulmonary:     Effort: Pulmonary effort is normal. No respiratory distress.     Breath sounds: Normal breath sounds. No rhonchi.  Abdominal:     General: Bowel sounds are normal.     Palpations: Abdomen is soft.  Musculoskeletal:        General: Normal range of motion.     Cervical back: Normal range of motion.     Comments: Right leg with 2, cystic feeling areas noted to distal right medial thigh/knee, these are locally tender without overlying skin changes or warmth to touch, no calf swelling or  asymmetry, no palpable cords   Skin:    General: Skin is warm and dry.  Neurological:     Mental Status: He is alert and oriented to person, place, and time.    ED Results / Procedures / Treatments   Labs (all labs ordered are listed, but only abnormal results are displayed) Labs Reviewed  I-STAT CHEM 8, ED - Abnormal; Notable for the following components:      Result Value   Glucose, Bld 135 (*)    All other components within normal limits    EKG None  Radiology DG Knee Complete 4 Views Right  Result Date: 12/10/2021 CLINICAL DATA:  Cramping, right knee pain EXAM: RIGHT KNEE - COMPLETE 4+ VIEW COMPARISON:  None. FINDINGS: Frontal, bilateral oblique, lateral views of the right knee are obtained. No fracture, subluxation, or dislocation. Joint spaces are well preserved. No joint effusion. IMPRESSION: 1. Unremarkable right knee. Electronically Signed   By: Sharlet Salina M.D.   On: 12/10/2021 00:52    Procedures Procedures   Medications Ordered in ED Medications - No data to display  ED Course  I have reviewed the triage vital signs and the nursing notes.  Pertinent labs & imaging results that were available during my care of the patient were reviewed by me and considered in my medical decision making (see chart for details).    MDM Rules/Calculators/A&P                         45 year old male here with right medial thigh/knee pain.  Began yesterday and today noticed to "knots".  He does have 2 small palpable structures, almost like cysts, along the distal right medial thigh.  There is no overlying erythema or induration but these are tender to palpation.  X-rays negative.  Normal potassium, no clear explanation for his cramping.  Unable to get vascular ultrasound at this hour for further evaluation so we will arrange to be done in the morning at [facility].  He is aware if no acute findings he will need to follow-up with primary care doctor, if there are acute findings he  will be directed to the ED for further management.  He will return here for any new or acute changes.  Final Clinical Impression(s) / ED Diagnoses Final diagnoses:  Right leg pain    Rx / DC Orders ED Discharge Orders  Ordered    LE VENOUS        12/10/21 0126             Garlon Hatchet, PA-C 12/10/21 0131    Heath, Mark Ruiz, MD 12/10/21 [phone]

## 2021-12-10 NOTE — Progress Notes (Signed)
VASCULAR LAB    Right lower extremity venous duplex has been performed.  See CV proc for preliminary results.   Dupree Givler, RVT 12/10/2021, 12:04 PM

## 2021-12-10 NOTE — ED Triage Notes (Signed)
Pt reports nontraumatic right inner leg pain around knee.Sts a bad leg cramp, and then stood up noticing a bump. Ambulatory.

## 2021-12-16 ENCOUNTER — Encounter: Payer: Self-pay | Admitting: Family Medicine

## 2021-12-16 ENCOUNTER — Ambulatory Visit: Payer: BC Managed Care – PPO | Admitting: Family Medicine

## 2021-12-16 ENCOUNTER — Other Ambulatory Visit: Payer: Self-pay

## 2021-12-16 VITALS — BP 134/93 | HR 82 | Temp 98.0°F | Ht 70.0 in | Wt 197.4 lb

## 2021-12-16 DIAGNOSIS — I82890 Acute embolism and thrombosis of other specified veins: Secondary | ICD-10-CM | POA: Diagnosis not present

## 2021-12-16 DIAGNOSIS — I8390 Asymptomatic varicose veins of unspecified lower extremity: Secondary | ICD-10-CM

## 2021-12-16 DIAGNOSIS — G4730 Sleep apnea, unspecified: Secondary | ICD-10-CM | POA: Diagnosis not present

## 2021-12-16 MED ORDER — RIVAROXABAN (XARELTO) VTE STARTER PACK (15 & 20 MG): ORAL_TABLET | ORAL | 0 refills | Status: AC

## 2021-12-16 MED ORDER — RIVAROXABAN 20 MG PO TABS: 20.0000 mg | ORAL_TABLET | Freq: Every day | ORAL | 0 refills | Status: AC

## 2021-12-16 NOTE — Assessment & Plan Note (Signed)
Longstanding issue.  We will place referral to vascular surgery for further management.

## 2021-12-16 NOTE — Assessment & Plan Note (Signed)
Likely has OSA.  Will place referral to sleep studies.

## 2021-12-16 NOTE — Patient Instructions (Signed)
It was very nice to see you today!  You have a blood clot in your leg.  We will need to start a blood thinner.  Please take the Xarelto 15 mg twice daily for 3 weeks and then switch to 20 mg daily.  He should be on a blood thinner for a total of 90 days.  We will give him a starter pack today and send in a prescription to your pharmacy for the remainder of the time.  I will refer you to see a vascular surgeon and for sleep studies.  Take care, Dr Jimmey Ralph  PLEASE NOTE:  If you had any lab tests please let us know if you have not heard back within a few days. You may see your results on mychart before we have a chance to review them but we will give you a call once they are reviewed by [place]. If we ordered any referrals today, please let us know if you have not heard from their office within the next week.   Please try these tips to maintain a healthy lifestyle:  Eat at least 3 REAL meals and 1-2 snacks per day.  Aim for no more than 5 hours between eating.  If you eat breakfast, please do so within one hour of getting up.   Each meal should contain half fruits/vegetables, one quarter protein, and one quarter carbs (no bigger than a computer mouse)  Cut down on sweet beverages. This includes juice, soda, and sweet tea.   Drink at least 1 glass of water with each meal and aim for at least 8 glasses per day  Exercise at least 150 minutes every week.

## 2021-12-16 NOTE — Progress Notes (Addendum)
° °  Mark Heath is a 46 y.o. male who presents today for an office visit.  Assessment/Plan:  New/Acute Problems: Superficial Vein Thrombosis  Ultrasound confirms thrombosis and greater saphenous vein.  Though his ultrasound did not show any DVT, due to the proximity near his popliteal vein and propagation the last several days we will start 34-month course of  full dose anticoagulation per guidelines.  No signs of PE.  Discussed reasons to return to care.  Superficial thrombosis likely secondary to his varicose veins-will be referred to vascular surgery for further management.  See below problem.  Chronic Problems Addressed Today: Sleep-disordered breathing Likely has OSA.  Will place referral to sleep studies.  Varicose vein of leg Longstanding issue.  We will place referral to vascular surgery for further management.     Subjective:  HPI:  Patient here for ED follow-up.  Went to the ED on 12/10/2021 with right leg pain after noticing knots on his distal thigh.  He was concerned about blood clot.  Went to the ED.  Initial work-up was negative and he was discharged home to have a follow-up ultrasound the next day.  Ultrasound was significant for acute superficial vein thrombosis of the right great saphenous vein.  He has done well in the last few days.  Still has some pain and swelling to the medial aspect of his right thigh.  Has tried ibuprofen which has helped.  Has a previous history of varicose veins.  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  He is on his feet most of the day.  He has tried using compression sleeves in the past but does not use this regularly.  His wife is also concerned about sleep apnea.  This has been going on for a long time.  He will snore and stop breathing able to sleep.  They request a referral for sleep study.  No reported chest pain or shortness of breath.  No leg swelling.       Objective:  Physical Exam: BP (!) 134/93    Pulse 82    Temp 98 F (36.7 C) (Temporal)     Ht 5\' 10"  (1.778 m)    Wt 197 lb 6.4 oz (89.5 kg)    SpO2 97%    BMI 28.32 kg/m   Gen: No acute distress, resting comfortably MSK: Thrombophlebitis noted on distal aspect of left medial thigh near popliteal junction. Neuro: Grossly normal, moves all extremities Psych: Normal affect and thought content  Time Spent: 45 minutes of total time was spent on the date of the encounter performing the following actions: chart review prior to seeing the patient including recent ED visit and ultrasound results, obtaining history, performing a medically necessary exam, counseling on the treatment plan, placing orders, and documenting in our EHR.        Algis Greenhouse. Jerline Pain, MD 12/16/2021 11:52 AM

## 2022-01-26 ENCOUNTER — Other Ambulatory Visit: Payer: Self-pay

## 2022-01-26 DIAGNOSIS — I8390 Asymptomatic varicose veins of unspecified lower extremity: Secondary | ICD-10-CM

## 2022-02-03 ENCOUNTER — Ambulatory Visit (INDEPENDENT_AMBULATORY_CARE_PROVIDER_SITE_OTHER): Payer: BC Managed Care – PPO | Admitting: Physician Assistant

## 2022-02-03 ENCOUNTER — Ambulatory Visit (HOSPITAL_COMMUNITY)
Admission: RE | Admit: 2022-02-03 | Discharge: 2022-02-03 | Disposition: A | Discharge status: Home / Self Care | Payer: BC Managed Care – PPO | Source: Ambulatory Visit | Attending: Vascular Surgery | Admitting: Vascular Surgery

## 2022-02-03 ENCOUNTER — Other Ambulatory Visit: Payer: Self-pay

## 2022-02-03 ENCOUNTER — Encounter: Payer: Self-pay | Admitting: Physician Assistant

## 2022-02-03 VITALS — BP 119/83 | HR 98 | Temp 98.6°F | Resp 20 | Ht 70.0 in | Wt 197.6 lb

## 2022-02-03 DIAGNOSIS — I8390 Asymptomatic varicose veins of unspecified lower extremity: Secondary | ICD-10-CM | POA: Diagnosis not present

## 2022-02-03 DIAGNOSIS — I8391 Asymptomatic varicose veins of right lower extremity: Secondary | ICD-10-CM | POA: Diagnosis not present

## 2022-02-03 DIAGNOSIS — I872 Venous insufficiency (chronic) (peripheral): Secondary | ICD-10-CM | POA: Diagnosis not present

## 2022-02-03 NOTE — Progress Notes (Signed)
Requested by:  Ardith Dark, MD [address] [place],  [place] [address]  Reason for consultation: superficial vein thrombosis, varicose veins of lower extremity    History of Present Illness   Mark Heath is a 46 y.o. (Jul 12, 1976) male who presents for evaluation of superficial venous thrombosis of RLE and varicose veins. Patient explains that in December he woke up with severe cramp in right thigh. Cramping in his legs is not unusual for him at night but this seemed more intense then usual.  By next day he had warmth and swelling in right thigh and when it did not get any better he decided to go to ER. He was diagnosed with thrombosis in right greater saphenous vein and varicose veins. He was started on Xarelto. He has been compliant with his Xarelto since. His right leg feels much better. He does still report some mild swelling and discomfort in his legs. He works for [employer] so he does a lot of standing and lifting at work. He does elevate his legs some both with wedge and with pillows. He has never worn compression stockings before. He has no history of DVT. He has possible family history of DVT but is not sure. He does have history of venous disease in his family. Reports sister with issues with varicose veins and prior surgeries for this.   Venous symptoms include: aching, tired, swelling Onset/duration:  >3 months  Occupation:  [employer] Aggravating factors: sitting, standing Alleviating factors: elevation Compression:  no Helps:  n/a Pain medications:  none Previous vein procedures:  none History of DVT:  No  Past Medical History:  Diagnosis Date   Bleeding nose    as a child   Chicken pox    GERD (gastroesophageal reflux disease)    Seasonal allergies    Sinus problem     Past Surgical History:  Procedure Laterality Date   APPENDECTOMY  2006   HERNIA REPAIR  july 2012   umbilical & left incarcerated inguinal repair with mesh   INGUINAL HERNIA REPAIR Left 08/12/2015    Procedure:  INCARCERATED HERNIA  INGUINAL REPAIR WITH MESH;  Surgeon: Emelia Loron, MD;  Location: [facility] OR;  Service: General;  Laterality: Left;   TONSILLECTOMY AND ADENOIDECTOMY      Social History   Socioeconomic History   Marital status: Married    Spouse name: Not on file   Number of children: Not on file   Years of education: Not on file   Highest education level: Not on file  Occupational History   Not on file  Tobacco Use   Smoking status: Never    Passive exposure: Never   Smokeless tobacco: Never  Vaping Use   Vaping Use: Never used  Substance and Sexual Activity   Alcohol use: Yes    Comment: Occ   Drug use: No   Sexual activity: Not on file  Other Topics Concern   Not on file  Social History Narrative   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      Spiritual Beliefs: Christain      Lifestyle: no regula exercise; diet is poor            Social Determinants of Corporate investment banker Strain: Not on file  Food Insecurity: Not on file  Transportation Needs: Not on file  Physical Activity: Not on file  Stress: Not on file  Social Connections: Not on file  Intimate Partner Violence: Not on file    Family History  Problem Relation Age of Onset   Hypertension Mother    Heart disease Mother 90   Arthritis Mother    Hyperlipidemia Mother    Stroke Mother    Diverticulitis Mother    Breast cancer Mother    Hypertension Father    Arthritis Father    Hyperlipidemia Father    Diabetes Father    Dementia Father     Current Outpatient Medications  Medication Sig Dispense Refill   ELDERBERRY PO Take by mouth.     Multiple Vitamin (MULTIVITAMIN) capsule Take 1 capsule by mouth daily.     rivaroxaban (XARELTO) 20 MG TABS tablet Take 1 tablet (20 mg total) by mouth daily with supper. 60 tablet 0   RIVAROXABAN (XARELTO) VTE STARTER PACK (15 & 20 MG) Follow package directions: Take one 15mg  tablet  by mouth twice a day. On day 22, switch to one 20mg  tablet once a day. Take with food. 51 each 0   Specialty Vitamins Products (MAGNESIUM, AMINO ACID CHELATE,) 133 MG tablet Take 1 tablet by mouth 2 (two) times daily.     No current facility-administered medications for this visit.    No Known Allergies  REVIEW OF SYSTEMS (negative unless checked):   Cardiac:  []  Chest pain or chest pressure? []  Shortness of breath upon activity? []  Shortness of breath when lying flat? []  Irregular heart rhythm?  Vascular:  []  Pain in calf, thigh, or hip brought on by walking? []  Pain in feet at night that wakes you up from your sleep? [x]  Blood clot in your veins? [x]  Leg swelling?  Pulmonary:  []  Oxygen at home? []  Productive cough? []  Wheezing?  Neurologic:  []  Sudden weakness in arms or legs? []  Sudden numbness in arms or legs? []  Sudden onset of difficult speaking or slurred speech? []  Temporary loss of vision in one eye? []  Problems with dizziness?  Gastrointestinal:  []  Blood in stool? []  Vomited blood?  Genitourinary:  []  Burning when urinating? []  Blood in urine?  Psychiatric:  []  Major depression  Hematologic:  []  Bleeding problems? []  Problems with blood clotting?  Dermatologic:  []  Rashes or ulcers?  Constitutional:  []  Fever or chills?  Ear/Nose/Throat:  []  Change in hearing? []  Nose bleeds? []  Sore throat?  Musculoskeletal:  []  Back pain? []  Joint pain? []  Muscle pain?   Physical Examination     Vitals:   02/03/22 1200  BP: 119/83  Pulse: 98  Resp: 20  Temp: 98.6 F (37 C)  TempSrc: Temporal  SpO2: 96%  Weight: 197 lb 9.6 oz (89.6 kg)  Height: 5\' 10"  (1.778 m)   Body mass index is 28.35 kg/m.  General:  WDWN in NAD; vital signs documented above Gait: Normal HENT: WNL, normocephalic Pulmonary: normal non-labored breathing , without wheezing Cardiac: regular HR, without  Murmurs without carotid bruit Abdomen: soft, NT, no  masses Vascular Exam/Pulses:  Right Left  Radial 2+ (normal) 2+ (normal)  Femoral 2+ (normal) 2+ (normal)  Popliteal 2+ (normal) 2+ (normal)  DP 2+ (normal) 2+ (normal)  PT 2+ (normal) 2+ (normal)   Extremities: without varicose veins of both right and left legs, with reticular veins bilateral thighs and lower legs, without edema, without stasis pigmentation, without lipodermatosclerosis, without ulcers Musculoskeletal: no muscle wasting or atrophy  Neurologic: A&O X 3;  No focal weakness or paresthesias are detected Psychiatric:  The pt has Normal affect.  Non-invasive Vascular Imaging   BLE Venous Insufficiency Duplex (02/03/22):  RLE:  No DVT , + SVT in GSV and SSV GSV reflux SFJ to mid thigh then it becomes thrombosed GSV diameter 0.54-0.59 No SSV reflux CFV, popliteal deep venous reflux  Medical Decision Making   Mark Heath is a 46 y.o. male who presents with: RLE chronic venous insufficiency with varicose veins. He was diangosed with thrombosis of right GSV and varicosiites. He has been managed on Xarelto. His symptoms from this have resolved. He does have cramping, swelling, tiredness and discomfort in his legs. His duplex today shows the GSV and SSV thrombus, he has no DVT. He has both deep and superficial venous insufficiency. His GSV is of adequate size to be considered for ablation.  Based on the patient's history and examination, I recommend: daily elevation above level of his heart for 20-30 min, thigh high compression stockings, exercise, and refraining from prolonged sitting or standing. I discussed with the patient the use of her 20-30 mm thigh high compression stockings and need for 3 month trial of such. He was fitted for both knee high and thigh high today The patient will follow up in 3 months MD to be evaluated for venous ablation    Karoline Caldwell, PA-C [facility]: [phone]  02/03/2022, 12:45 PM  Clinic MD:  Dr. Donzetta Matters

## 2022-05-05 ENCOUNTER — Ambulatory Visit: Payer: BC Managed Care – PPO | Admitting: Vascular Surgery

## 2022-09-06 ENCOUNTER — Encounter: Payer: Self-pay | Admitting: *Deleted

## 2022-11-25 ENCOUNTER — Encounter: Payer: Self-pay | Admitting: *Deleted

## 2023-10-17 IMAGING — CR DG KNEE COMPLETE 4+V*R*
4 series · 4 of 4 positions shown · non-contrast
Comparison: None.

CLINICAL DATA: Cramping, right knee pain

EXAM:
RIGHT KNEE - COMPLETE 4+ VIEW

[t knee ap right]
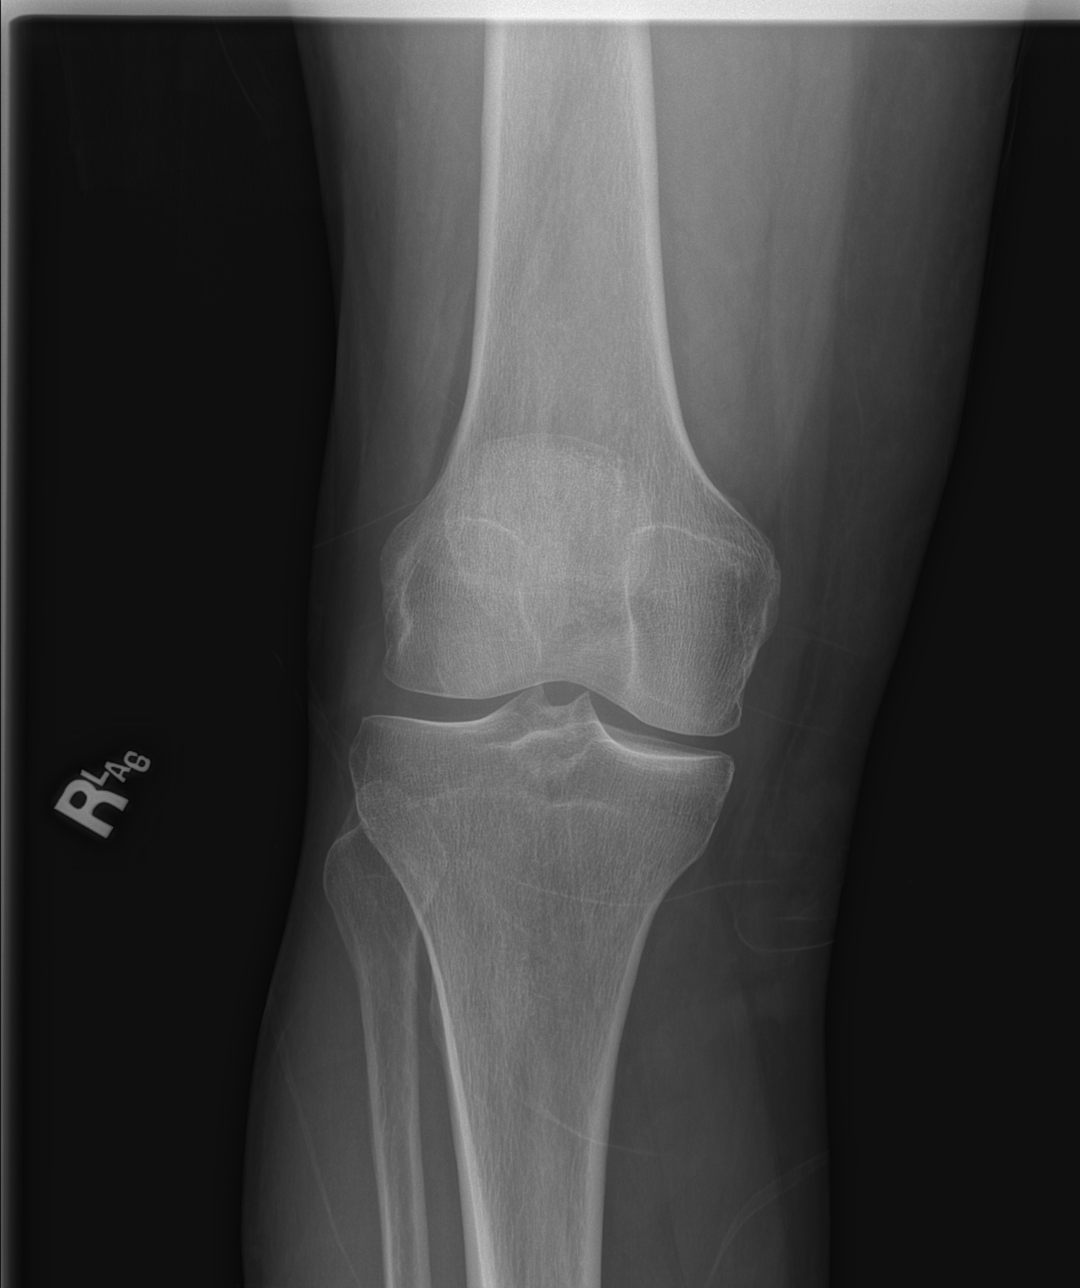

[t knee obl right (1 of 2)]
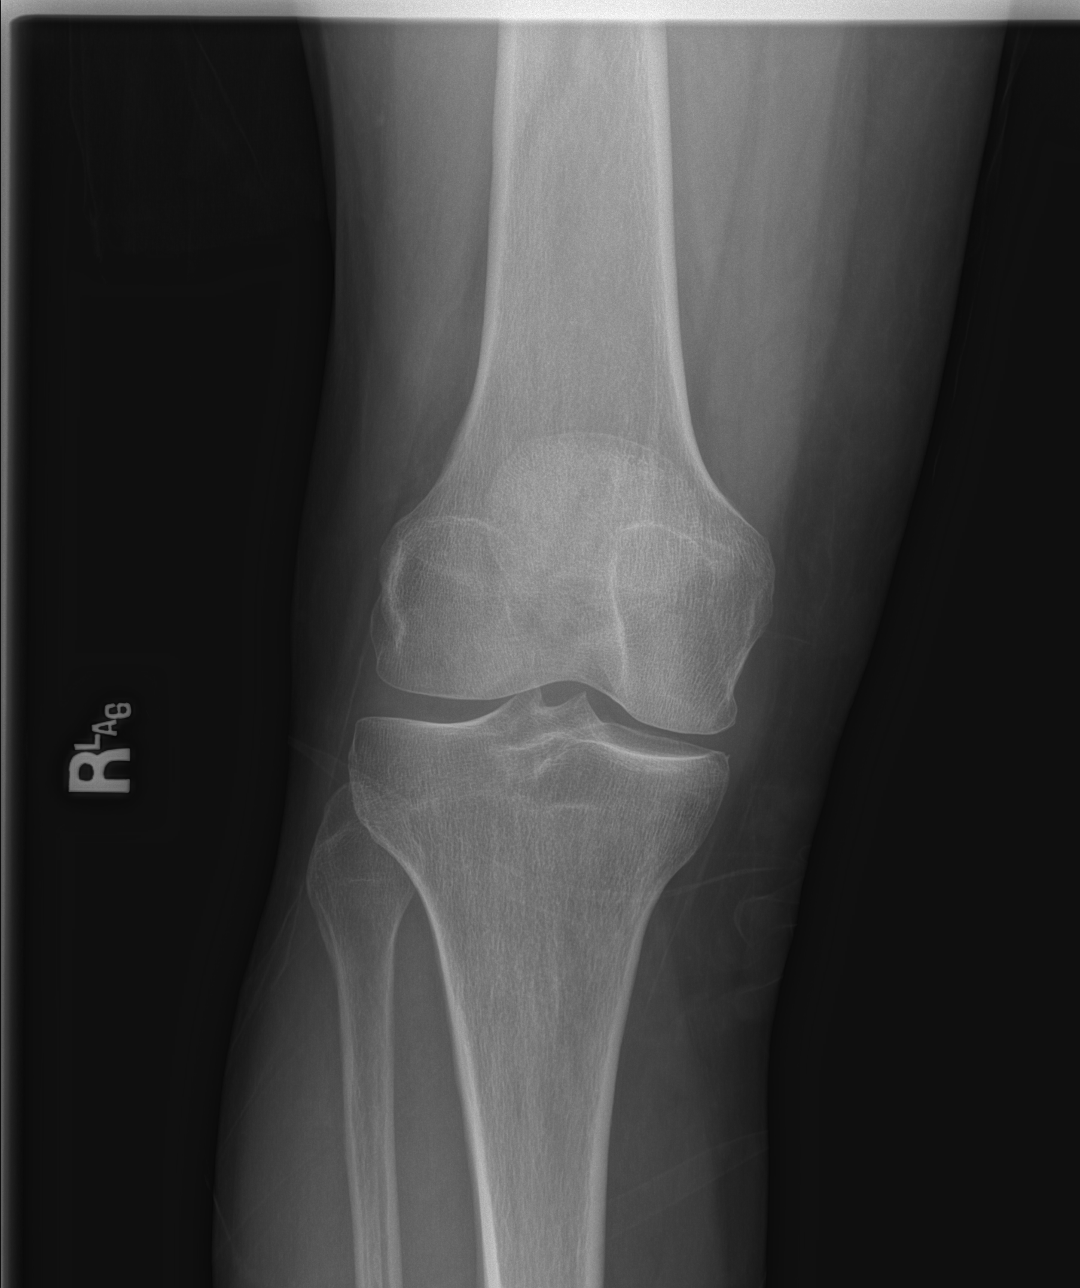

[t knee obl right (2 of 2)]
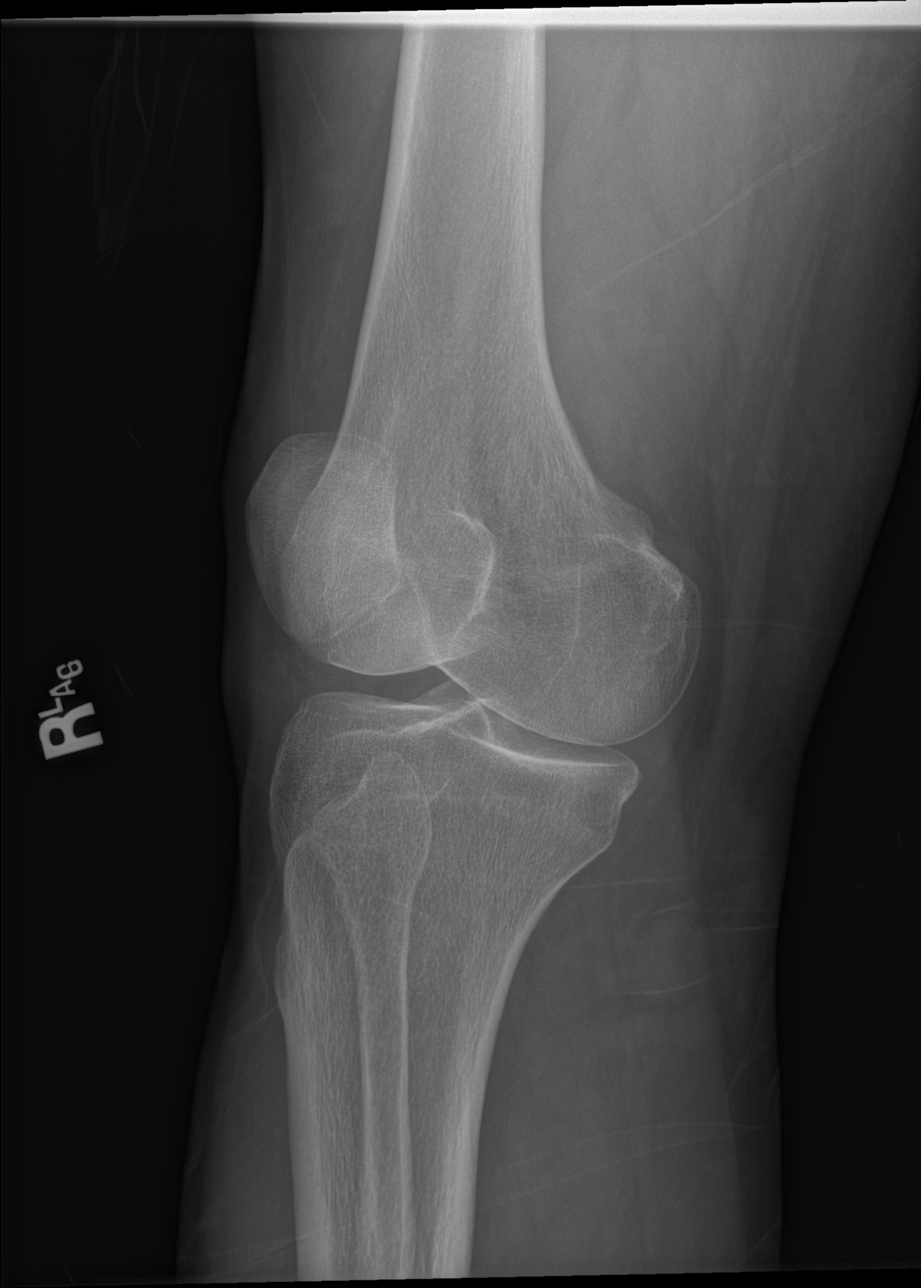

[t knee lat right]
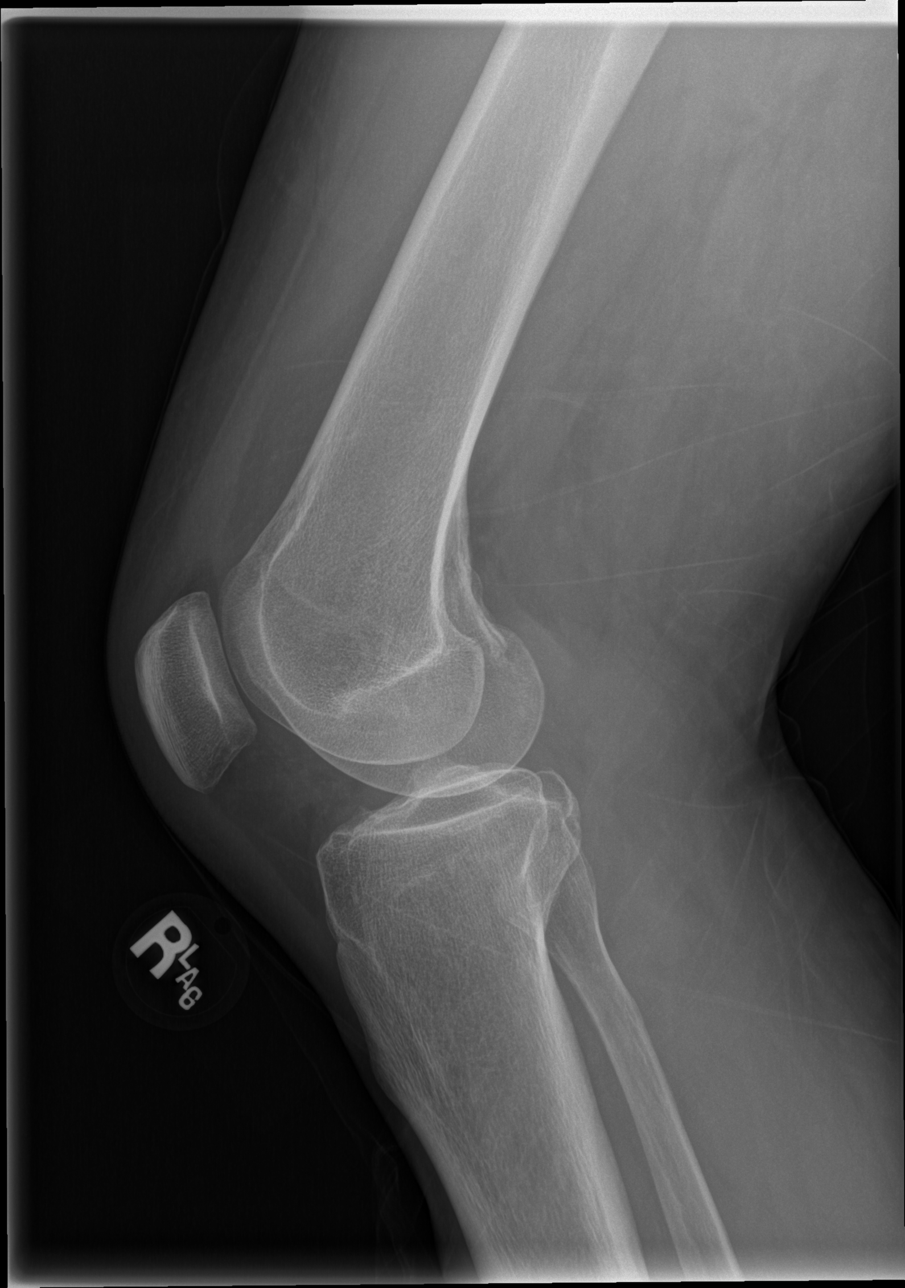

[4 of 4 positions shown; findings below may reference images not displayed]

FINDINGS: Frontal, bilateral oblique, lateral views of the right knee are
obtained. No fracture, subluxation, or dislocation. Joint spaces are
well preserved. No joint effusion.
IMPRESSION: 1. Unremarkable right knee.
